# Patient Record
Sex: Female | Born: 1950 | Race: White | Hispanic: No | Smoking: Current every day smoker
Health system: Southern US, Community
[De-identification: ages and names within clinical notes are randomized; demographics above are authoritative.]

## PROBLEM LIST (undated history)

## (undated) DIAGNOSIS — M810 Age-related osteoporosis without current pathological fracture: Secondary | ICD-10-CM

## (undated) DIAGNOSIS — M797 Fibromyalgia: Secondary | ICD-10-CM

## (undated) DIAGNOSIS — J449 Chronic obstructive pulmonary disease, unspecified: Secondary | ICD-10-CM

## (undated) DIAGNOSIS — G8929 Other chronic pain: Secondary | ICD-10-CM

## (undated) DIAGNOSIS — D649 Anemia, unspecified: Secondary | ICD-10-CM

## (undated) HISTORY — PX: BREAST BIOPSY: SHX20

## (undated) HISTORY — PX: KYPHOPLASTY: SHX5884

## (undated) HISTORY — PX: CHOLECYSTECTOMY: SHX55

## (undated) HISTORY — DX: Chronic obstructive pulmonary disease, unspecified: J44.9

## (undated) HISTORY — PX: WRIST SURGERY: SHX841

## (undated) HISTORY — DX: Fibromyalgia: M79.7

## (undated) HISTORY — PX: CATARACT EXTRACTION: SUR2

## (undated) HISTORY — PX: REPLACEMENT TOTAL KNEE: SUR1224

## (undated) HISTORY — DX: Anemia, unspecified: D64.9

## (undated) HISTORY — PX: OTHER SURGICAL HISTORY: SHX169

## (undated) HISTORY — PX: EYE SURGERY: SHX253

## (undated) HISTORY — PX: KNEE SURGERY: SHX244

## (undated) HISTORY — DX: Other chronic pain: G89.29

---

## 2003-06-12 ENCOUNTER — Other Ambulatory Visit: Payer: Self-pay

## 2003-06-16 ENCOUNTER — Other Ambulatory Visit: Payer: Self-pay

## 2003-06-18 ENCOUNTER — Other Ambulatory Visit: Payer: Self-pay

## 2003-07-06 ENCOUNTER — Other Ambulatory Visit: Payer: Self-pay

## 2004-04-09 ENCOUNTER — Ambulatory Visit: Payer: Self-pay | Admitting: Physician Assistant

## 2004-04-12 ENCOUNTER — Ambulatory Visit: Payer: Self-pay | Admitting: Pain Medicine

## 2004-05-01 ENCOUNTER — Ambulatory Visit: Payer: Self-pay | Admitting: Physician Assistant

## 2004-05-03 ENCOUNTER — Encounter: Payer: Self-pay | Admitting: Pain Medicine

## 2004-05-17 ENCOUNTER — Encounter: Payer: Self-pay | Admitting: Pain Medicine

## 2004-10-02 ENCOUNTER — Ambulatory Visit: Payer: Self-pay | Admitting: Physician Assistant

## 2004-10-09 ENCOUNTER — Ambulatory Visit: Payer: Self-pay | Admitting: Pain Medicine

## 2004-11-19 ENCOUNTER — Ambulatory Visit: Payer: Self-pay | Admitting: Physician Assistant

## 2004-12-03 ENCOUNTER — Ambulatory Visit: Payer: Self-pay | Admitting: Physician Assistant

## 2005-01-17 ENCOUNTER — Ambulatory Visit: Payer: Self-pay | Admitting: Physician Assistant

## 2005-01-29 ENCOUNTER — Encounter: Payer: Self-pay | Admitting: Pain Medicine

## 2005-01-31 ENCOUNTER — Ambulatory Visit: Payer: Self-pay | Admitting: Physician Assistant

## 2005-02-14 ENCOUNTER — Ambulatory Visit: Payer: Self-pay | Admitting: Pain Medicine

## 2005-02-15 ENCOUNTER — Encounter: Payer: Self-pay | Admitting: Pain Medicine

## 2005-03-06 ENCOUNTER — Ambulatory Visit: Payer: Self-pay | Admitting: Physician Assistant

## 2005-03-19 ENCOUNTER — Ambulatory Visit: Payer: Self-pay | Admitting: Pain Medicine

## 2005-04-23 ENCOUNTER — Ambulatory Visit: Payer: Self-pay | Admitting: Physician Assistant

## 2005-05-23 ENCOUNTER — Ambulatory Visit: Payer: Self-pay | Admitting: Physician Assistant

## 2005-05-30 ENCOUNTER — Ambulatory Visit: Payer: Self-pay | Admitting: Pain Medicine

## 2005-07-08 ENCOUNTER — Ambulatory Visit: Payer: Self-pay | Admitting: Physician Assistant

## 2005-07-16 ENCOUNTER — Ambulatory Visit: Payer: Self-pay | Admitting: Pain Medicine

## 2005-07-30 ENCOUNTER — Ambulatory Visit: Payer: Self-pay | Admitting: Physician Assistant

## 2005-08-06 ENCOUNTER — Ambulatory Visit: Payer: Self-pay | Admitting: Pain Medicine

## 2005-08-19 ENCOUNTER — Ambulatory Visit: Payer: Self-pay | Admitting: Physician Assistant

## 2005-08-26 ENCOUNTER — Ambulatory Visit: Payer: Self-pay | Admitting: Family Medicine

## 2005-10-22 ENCOUNTER — Ambulatory Visit: Payer: Self-pay | Admitting: Physician Assistant

## 2005-10-26 ENCOUNTER — Emergency Department: Payer: Self-pay | Admitting: General Practice

## 2005-11-04 ENCOUNTER — Ambulatory Visit: Payer: Self-pay | Admitting: Physician Assistant

## 2006-01-03 ENCOUNTER — Ambulatory Visit: Payer: Self-pay | Admitting: Unknown Physician Specialty

## 2006-01-14 ENCOUNTER — Ambulatory Visit: Payer: Self-pay | Admitting: Unknown Physician Specialty

## 2006-02-27 ENCOUNTER — Inpatient Hospital Stay: Payer: Self-pay | Admitting: Specialist

## 2006-02-27 ENCOUNTER — Other Ambulatory Visit: Payer: Self-pay

## 2006-04-25 ENCOUNTER — Ambulatory Visit: Payer: Self-pay | Admitting: Physician Assistant

## 2006-05-26 ENCOUNTER — Ambulatory Visit: Payer: Self-pay | Admitting: Physician Assistant

## 2006-05-29 ENCOUNTER — Ambulatory Visit: Payer: Self-pay | Admitting: Specialist

## 2006-06-03 ENCOUNTER — Ambulatory Visit: Payer: Self-pay | Admitting: Pain Medicine

## 2006-06-18 ENCOUNTER — Ambulatory Visit: Payer: Self-pay | Admitting: Pain Medicine

## 2006-06-24 ENCOUNTER — Ambulatory Visit: Payer: Self-pay | Admitting: Pain Medicine

## 2006-07-16 ENCOUNTER — Ambulatory Visit: Payer: Self-pay | Admitting: Physician Assistant

## 2006-07-28 ENCOUNTER — Ambulatory Visit: Payer: Self-pay | Admitting: Physician Assistant

## 2006-08-01 ENCOUNTER — Ambulatory Visit: Payer: Self-pay | Admitting: Ophthalmology

## 2006-08-11 ENCOUNTER — Ambulatory Visit: Payer: Self-pay | Admitting: Ophthalmology

## 2006-08-28 ENCOUNTER — Ambulatory Visit: Payer: Self-pay | Admitting: Physician Assistant

## 2006-09-30 ENCOUNTER — Ambulatory Visit: Payer: Self-pay | Admitting: Ophthalmology

## 2006-09-30 ENCOUNTER — Ambulatory Visit: Payer: Self-pay | Admitting: Anesthesiology

## 2006-10-06 ENCOUNTER — Ambulatory Visit: Payer: Self-pay | Admitting: Ophthalmology

## 2006-10-21 ENCOUNTER — Ambulatory Visit: Payer: Self-pay | Admitting: Physician Assistant

## 2006-11-11 ENCOUNTER — Ambulatory Visit: Payer: Self-pay | Admitting: Physician Assistant

## 2006-11-11 ENCOUNTER — Ambulatory Visit: Payer: Self-pay | Admitting: Pain Medicine

## 2006-11-27 ENCOUNTER — Ambulatory Visit: Payer: Self-pay | Admitting: Physician Assistant

## 2006-12-16 ENCOUNTER — Ambulatory Visit: Payer: Self-pay | Admitting: Pain Medicine

## 2006-12-31 ENCOUNTER — Ambulatory Visit: Payer: Self-pay | Admitting: Physician Assistant

## 2007-01-08 ENCOUNTER — Ambulatory Visit: Payer: Self-pay | Admitting: Pain Medicine

## 2007-01-22 ENCOUNTER — Ambulatory Visit: Payer: Self-pay | Admitting: Physician Assistant

## 2007-02-04 ENCOUNTER — Ambulatory Visit: Payer: Self-pay | Admitting: Pain Medicine

## 2007-02-18 ENCOUNTER — Ambulatory Visit: Payer: Self-pay | Admitting: Pain Medicine

## 2007-03-04 ENCOUNTER — Ambulatory Visit: Payer: Self-pay | Admitting: Rheumatology

## 2007-03-23 ENCOUNTER — Ambulatory Visit: Payer: Self-pay | Admitting: Pain Medicine

## 2007-03-26 ENCOUNTER — Ambulatory Visit: Payer: Self-pay | Admitting: Pain Medicine

## 2007-06-08 ENCOUNTER — Ambulatory Visit: Payer: Self-pay | Admitting: Rheumatology

## 2007-06-09 ENCOUNTER — Emergency Department: Payer: Self-pay | Admitting: Emergency Medicine

## 2007-06-17 ENCOUNTER — Ambulatory Visit: Payer: Self-pay | Admitting: Rheumatology

## 2007-09-30 ENCOUNTER — Ambulatory Visit: Payer: Self-pay | Admitting: Family Medicine

## 2007-10-16 ENCOUNTER — Emergency Department: Payer: Self-pay | Admitting: Emergency Medicine

## 2007-12-22 ENCOUNTER — Other Ambulatory Visit: Payer: Self-pay

## 2007-12-22 ENCOUNTER — Emergency Department: Payer: Self-pay | Admitting: Emergency Medicine

## 2008-01-14 ENCOUNTER — Other Ambulatory Visit: Payer: Self-pay

## 2008-01-14 ENCOUNTER — Inpatient Hospital Stay: Payer: Self-pay | Admitting: Internal Medicine

## 2008-01-18 ENCOUNTER — Ambulatory Visit: Payer: Self-pay | Admitting: Unknown Physician Specialty

## 2008-01-28 ENCOUNTER — Ambulatory Visit: Payer: Self-pay | Admitting: Unknown Physician Specialty

## 2008-02-02 ENCOUNTER — Other Ambulatory Visit: Payer: Self-pay

## 2008-02-02 ENCOUNTER — Inpatient Hospital Stay: Payer: Self-pay | Admitting: Internal Medicine

## 2008-07-19 ENCOUNTER — Emergency Department: Payer: Self-pay | Admitting: Emergency Medicine

## 2008-08-05 ENCOUNTER — Ambulatory Visit: Payer: Self-pay | Admitting: Orthopedic Surgery

## 2008-08-05 ENCOUNTER — Ambulatory Visit: Payer: Self-pay | Admitting: Cardiology

## 2008-08-09 ENCOUNTER — Ambulatory Visit: Payer: Self-pay | Admitting: Orthopedic Surgery

## 2008-11-16 ENCOUNTER — Ambulatory Visit: Payer: Self-pay | Admitting: Orthopedic Surgery

## 2008-11-23 ENCOUNTER — Ambulatory Visit: Payer: Self-pay | Admitting: Orthopedic Surgery

## 2008-12-23 ENCOUNTER — Ambulatory Visit: Payer: Self-pay | Admitting: Unknown Physician Specialty

## 2009-01-16 ENCOUNTER — Ambulatory Visit: Payer: Self-pay | Admitting: Unknown Physician Specialty

## 2009-05-09 ENCOUNTER — Ambulatory Visit: Payer: Self-pay | Admitting: Anesthesiology

## 2009-11-03 ENCOUNTER — Ambulatory Visit: Payer: Self-pay | Admitting: Unknown Physician Specialty

## 2009-11-10 ENCOUNTER — Ambulatory Visit: Payer: Self-pay | Admitting: Unknown Physician Specialty

## 2010-06-05 ENCOUNTER — Ambulatory Visit: Payer: Self-pay | Admitting: Anesthesiology

## 2011-01-03 ENCOUNTER — Ambulatory Visit: Payer: Self-pay

## 2011-02-11 ENCOUNTER — Ambulatory Visit: Payer: Self-pay | Admitting: Unknown Physician Specialty

## 2011-02-21 ENCOUNTER — Inpatient Hospital Stay: Payer: Self-pay | Admitting: Unknown Physician Specialty

## 2011-02-28 ENCOUNTER — Inpatient Hospital Stay: Payer: Self-pay | Admitting: Internal Medicine

## 2011-02-28 DIAGNOSIS — R9431 Abnormal electrocardiogram [ECG] [EKG]: Secondary | ICD-10-CM

## 2011-08-21 ENCOUNTER — Ambulatory Visit: Payer: Self-pay | Admitting: Family Medicine

## 2011-10-07 ENCOUNTER — Emergency Department: Payer: Self-pay | Admitting: Emergency Medicine

## 2011-10-07 LAB — COMPREHENSIVE METABOLIC PANEL
Alkaline Phosphatase: 134 U/L (ref 50–136)
Anion Gap: 7 (ref 7–16)
BUN: 4 mg/dL — ABNORMAL LOW (ref 7–18)
Bilirubin,Total: 0.3 mg/dL (ref 0.2–1.0)
Calcium, Total: 8.9 mg/dL (ref 8.5–10.1)
Chloride: 105 mmol/L (ref 98–107)
Co2: 27 mmol/L (ref 21–32)
EGFR (African American): >60
EGFR (Non-African Amer.): >60
Glucose: 77 mg/dL (ref 65–99)
Potassium: 4.2 mmol/L (ref 3.5–5.1)
SGPT (ALT): 15 U/L
Sodium: 139 mmol/L (ref 136–145)
Total Protein: 7.8 g/dL (ref 6.4–8.2)

## 2011-10-07 LAB — URINALYSIS, COMPLETE
Bilirubin,UR: NEGATIVE
Blood: NEGATIVE
Glucose,UR: NEGATIVE mg/dL (ref 0–75)
Ketone: NEGATIVE
Leukocyte Esterase: NEGATIVE
Nitrite: NEGATIVE
Ph: 7 (ref 4.5–8.0)
RBC,UR: <1 /HPF (ref 0–5)
Specific Gravity: 1.003 (ref 1.003–1.030)

## 2011-10-07 LAB — ETHANOL
Ethanol %: <0.003 % (ref 0.000–0.080)
Ethanol: <3 mg/dL

## 2011-10-07 LAB — DRUG SCREEN, URINE
Barbiturates, Ur Screen: NEGATIVE (ref ?–200)
Benzodiazepine, Ur Scrn: POSITIVE (ref ?–200)
Cannabinoid 50 Ng, Ur ~~LOC~~: NEGATIVE (ref ?–50)
MDMA (Ecstasy)Ur Screen: NEGATIVE (ref ?–500)
Methadone, Ur Screen: NEGATIVE (ref ?–300)
Opiate, Ur Screen: NEGATIVE (ref ?–300)

## 2011-10-07 LAB — CBC
HCT: 37.7 % (ref 35.0–47.0)
MCH: 33.3 pg (ref 26.0–34.0)
MCHC: 33.7 g/dL (ref 32.0–36.0)
MCV: 99 fL (ref 80–100)
RBC: 3.82 10*6/uL (ref 3.80–5.20)

## 2011-10-07 LAB — SALICYLATE LEVEL: Salicylates, Serum: 3.8 mg/dL — ABNORMAL HIGH

## 2011-10-07 LAB — ACETAMINOPHEN LEVEL: Acetaminophen: <2 ug/mL

## 2011-11-15 ENCOUNTER — Other Ambulatory Visit: Payer: Self-pay | Admitting: Unknown Physician Specialty

## 2011-11-15 LAB — CLOSTRIDIUM DIFFICILE BY PCR

## 2011-11-17 LAB — STOOL CULTURE

## 2011-11-25 ENCOUNTER — Ambulatory Visit: Payer: Self-pay | Admitting: Unknown Physician Specialty

## 2011-11-27 LAB — PATHOLOGY REPORT

## 2012-04-01 ENCOUNTER — Ambulatory Visit: Payer: Self-pay | Admitting: Neurology

## 2012-04-02 ENCOUNTER — Ambulatory Visit: Payer: Self-pay | Admitting: Neurology

## 2012-08-24 ENCOUNTER — Ambulatory Visit: Payer: Self-pay | Admitting: Internal Medicine

## 2012-12-08 ENCOUNTER — Ambulatory Visit: Payer: Self-pay | Admitting: Unknown Physician Specialty

## 2013-02-17 ENCOUNTER — Ambulatory Visit: Payer: Self-pay | Admitting: Pain Medicine

## 2013-02-17 LAB — BASIC METABOLIC PANEL
Anion Gap: 7 (ref 7–16)
Calcium, Total: 9.5 mg/dL (ref 8.5–10.1)
Chloride: 106 mmol/L (ref 98–107)
Creatinine: 0.5 mg/dL — ABNORMAL LOW (ref 0.60–1.30)
EGFR (African American): >60
EGFR (Non-African Amer.): >60
Osmolality: 272 (ref 275–301)
Sodium: 137 mmol/L (ref 136–145)

## 2013-02-17 LAB — MAGNESIUM: Magnesium: 1.9 mg/dL

## 2013-02-17 LAB — SEDIMENTATION RATE: Erythrocyte Sed Rate: 33 mm/hr — ABNORMAL HIGH (ref 0–30)

## 2013-02-26 ENCOUNTER — Ambulatory Visit: Payer: Self-pay | Admitting: Unknown Physician Specialty

## 2013-03-09 ENCOUNTER — Ambulatory Visit: Payer: Self-pay | Admitting: Pain Medicine

## 2013-03-11 ENCOUNTER — Ambulatory Visit: Payer: Self-pay | Admitting: Pain Medicine

## 2013-03-31 ENCOUNTER — Ambulatory Visit: Payer: Self-pay | Admitting: Pain Medicine

## 2013-04-21 IMAGING — CR DG KNEE 1-2V*L*
1 series · 2 of 2 positions shown · non-contrast
Comparison: none

REASON FOR EXAM: postop
COMMENTS:   Bedside (portable):Y

PROCEDURE:     DXR - DXR KNEE LEFT AP AND LATERAL  - February 21, 2011  [DATE]
RESULT:

[Series 1: view not recorded · 0.17mm/px · 2 of 2 slices shown]
[im 1/2]
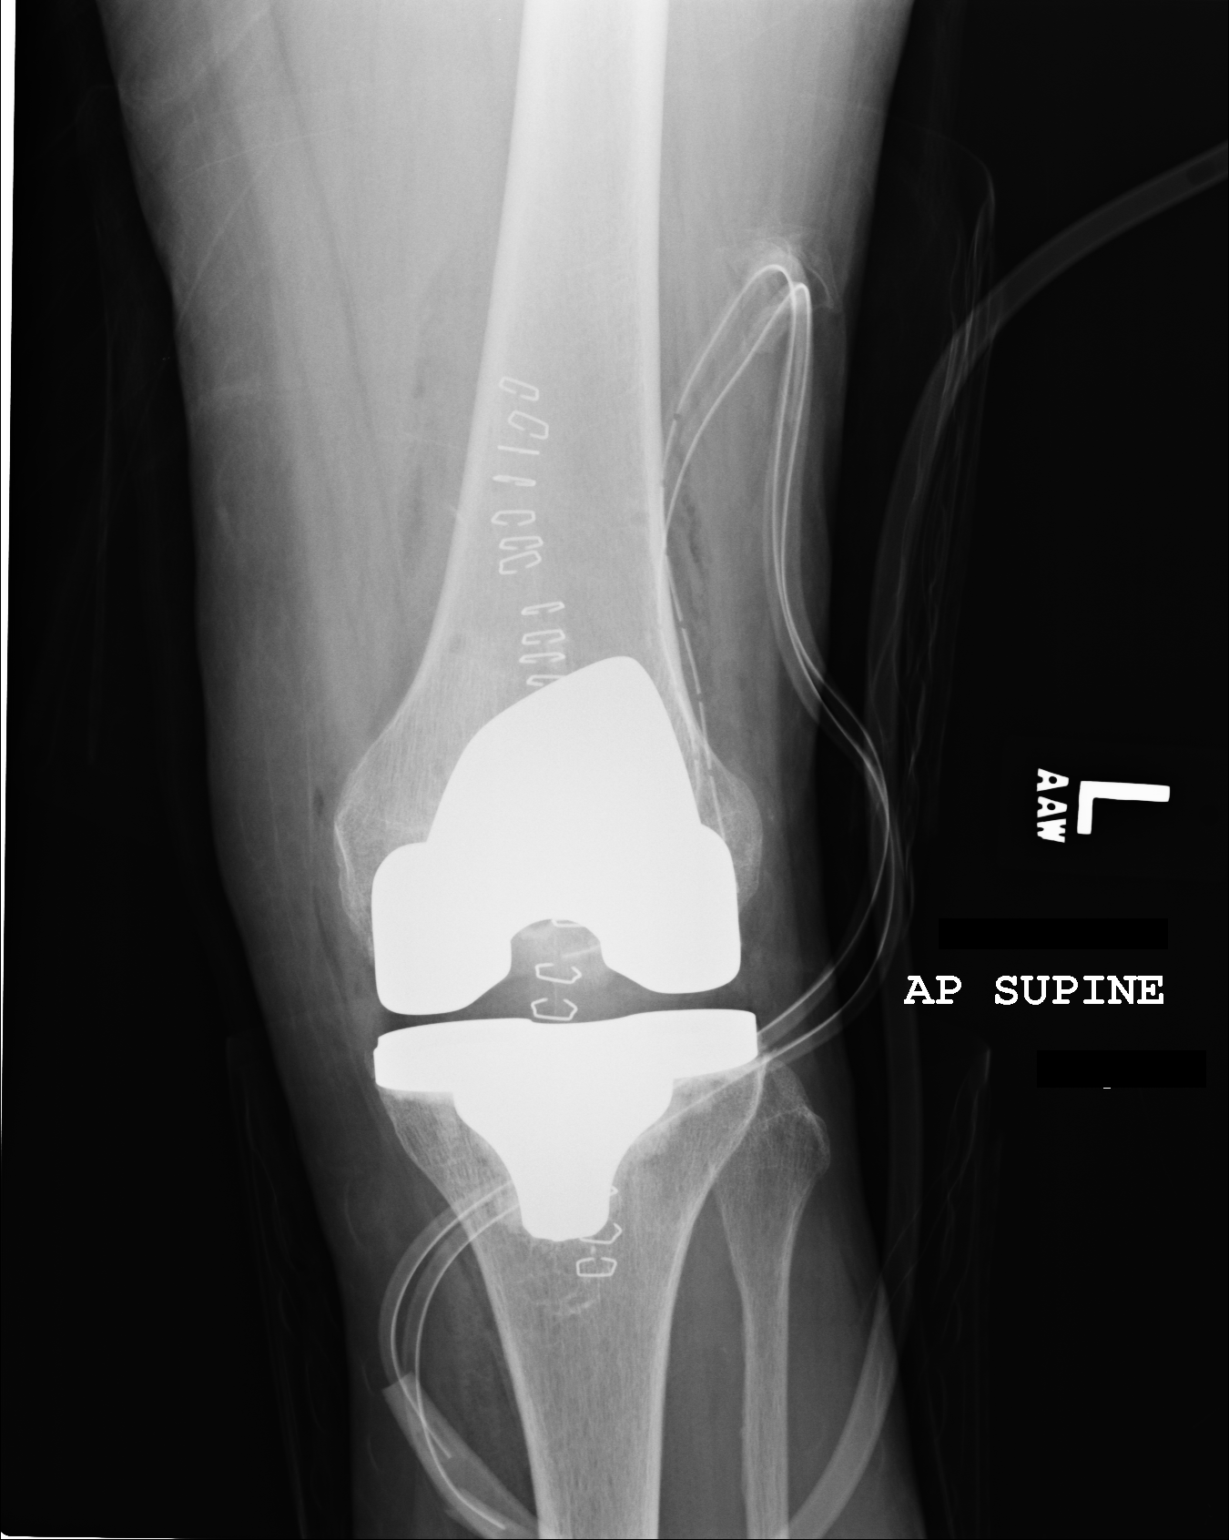
[im 2/2]
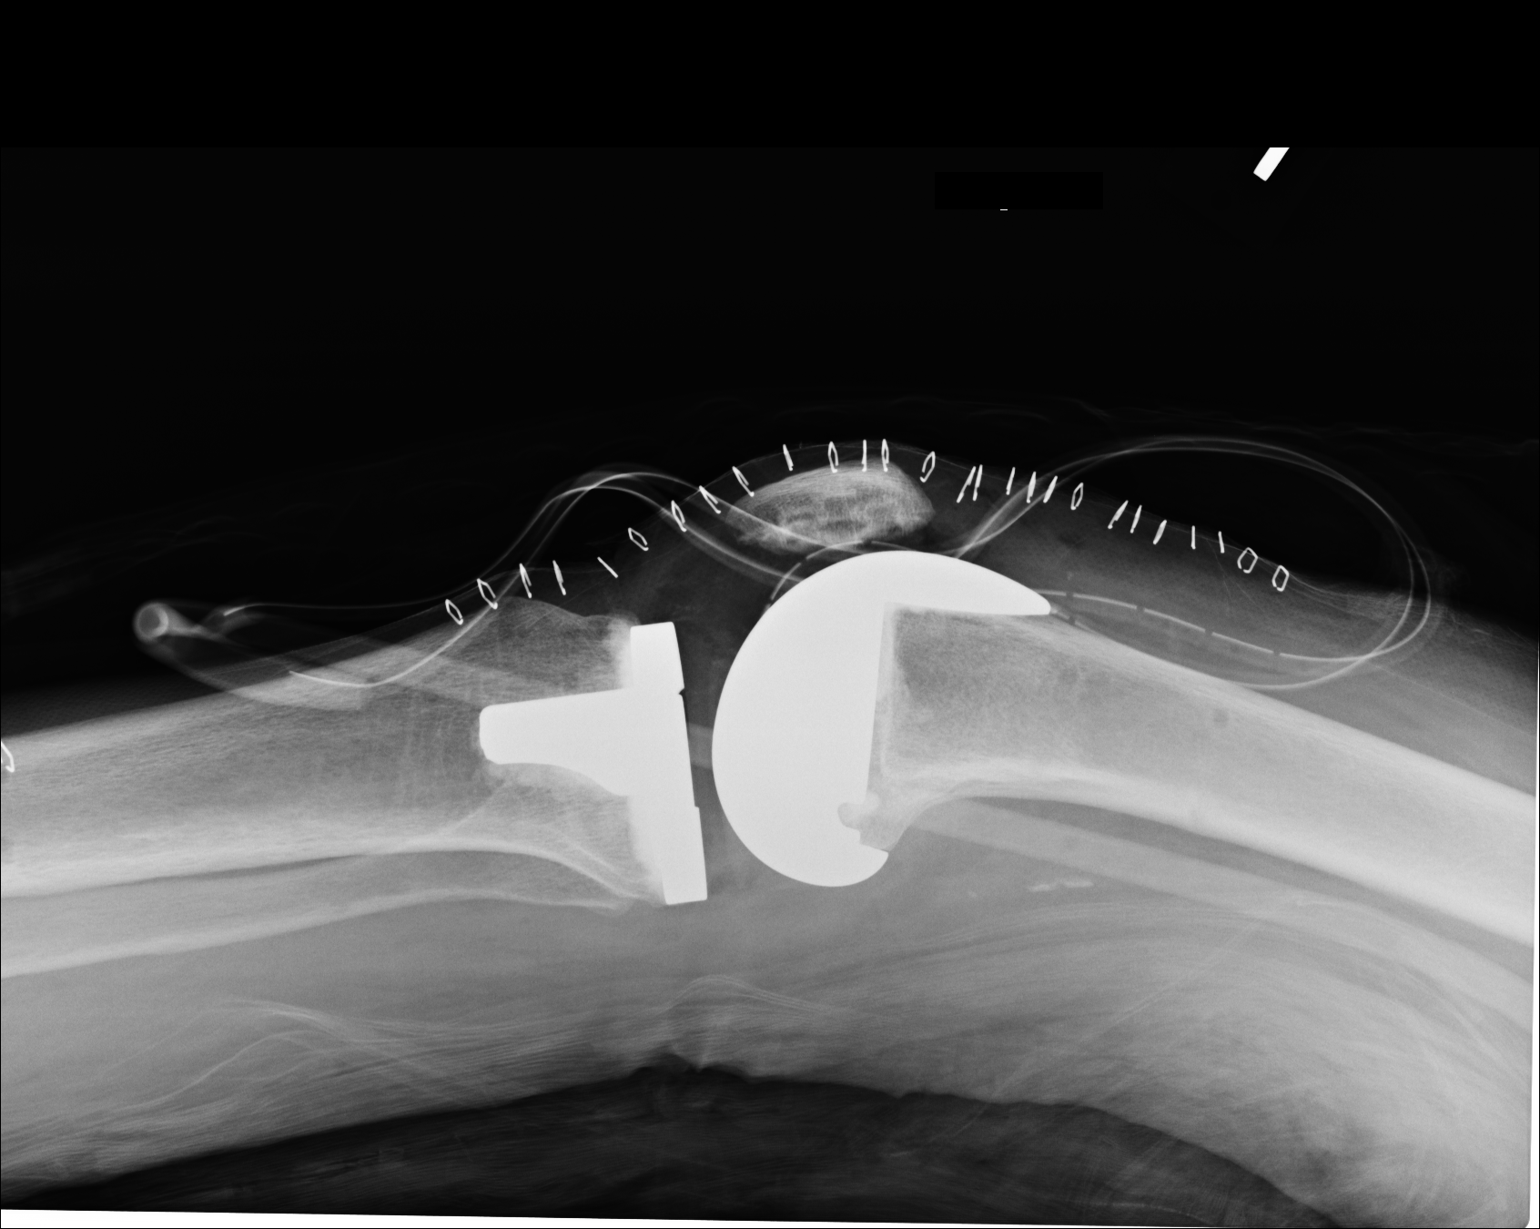

[2 of 2 positions shown; findings below may reference images not displayed]

FINDINGS: The patient is status post total left knee replacement. Hardware
appears intact without evidence of loosening or failure. Surgical drains and
skin staples are identified about the left knee. Native osseous structures
appear to be intact.
IMPRESSION: Postoperative left knee replacement. The remainder of the
interpretation will be left to the performing physician.

## 2013-04-27 IMAGING — CT CT ABD-PELV W/O CM
1 of 2 series · 15 of 32 positions shown, 19 images · non-contrast
Comparison: none

REASON FOR EXAM: (1) LLQ  pain  AMS; (2) same  AMS
COMMENTS:

[Series 2: stone · axial · 0.56mm/px · z∈[-384,+22]mm · 15 of 152 slices shown, 19 images]
[im 11/152  soft-tissue]
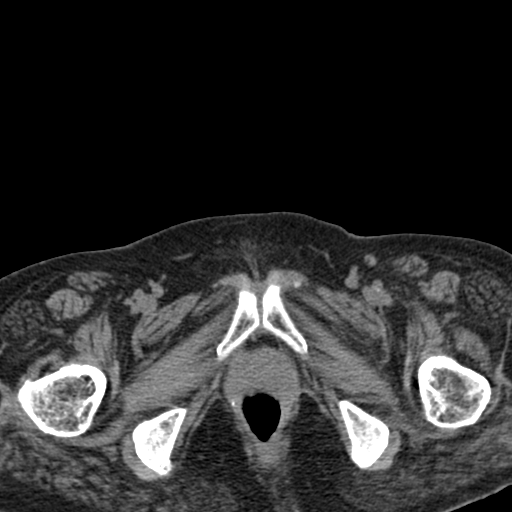
[im 11/152  bone]
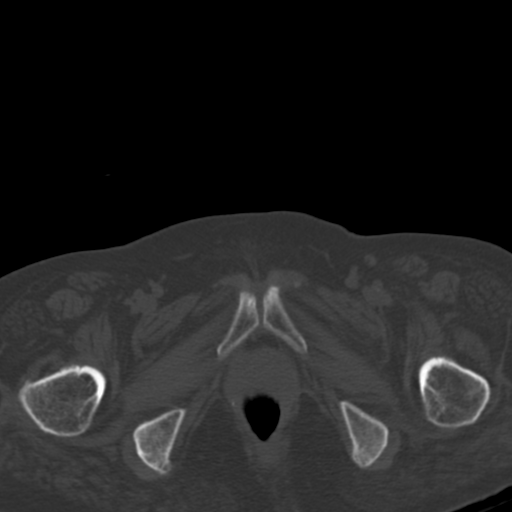
[im 22/152  soft-tissue]
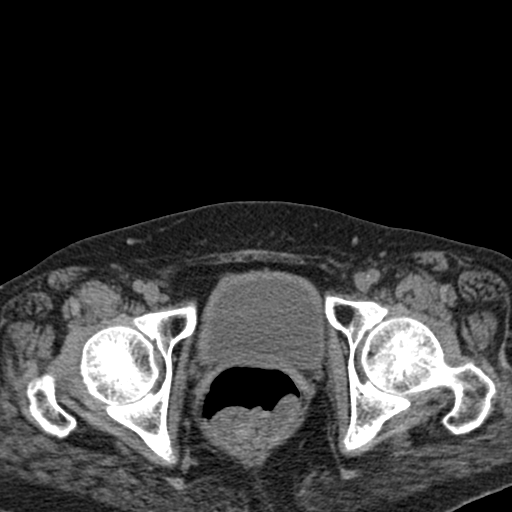
[im 33/152  soft-tissue]
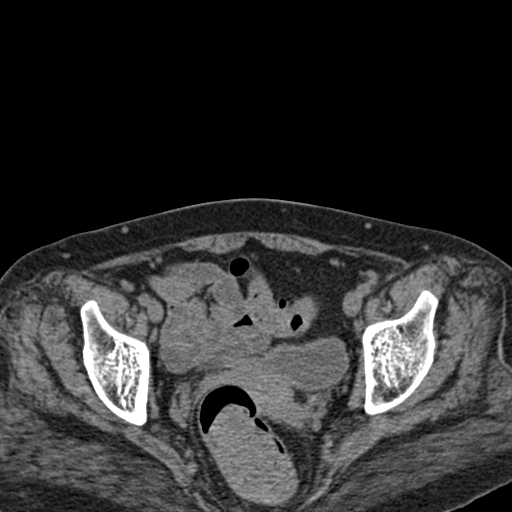
[im 44/152  soft-tissue]
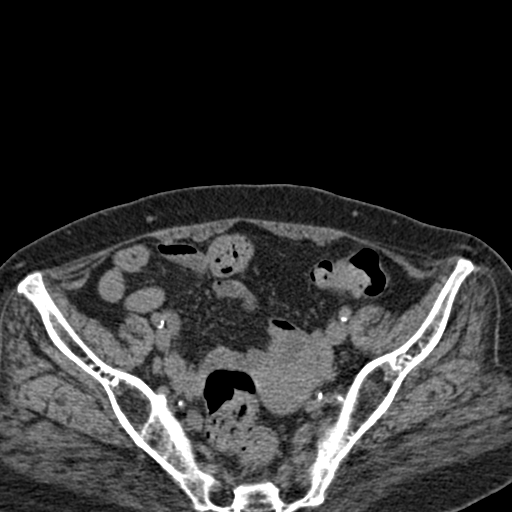
[im 54/152  soft-tissue]
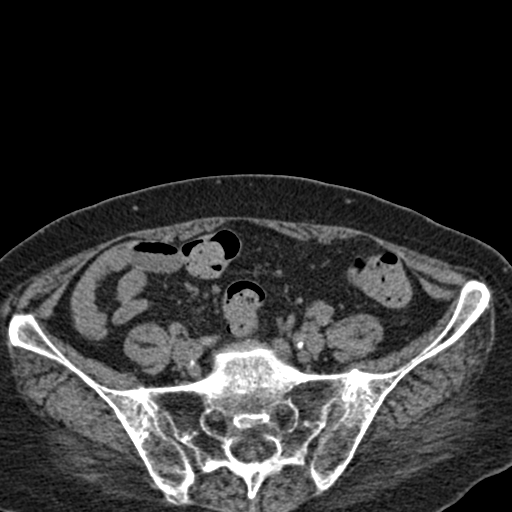
[im 65/152  soft-tissue]
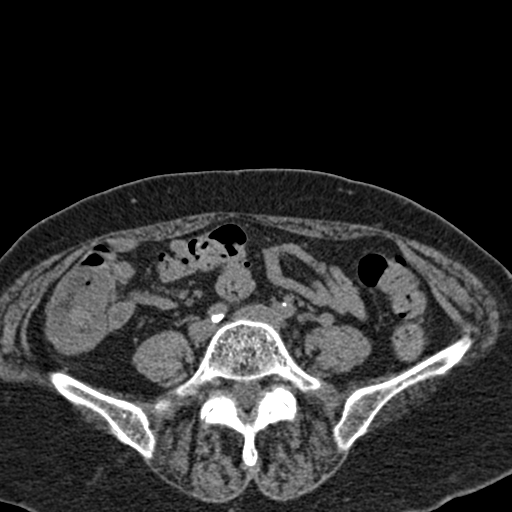
[im 76/152  soft-tissue]
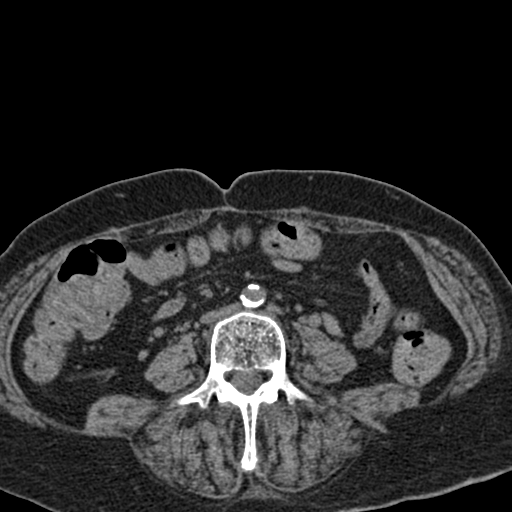
[im 87/152  soft-tissue]
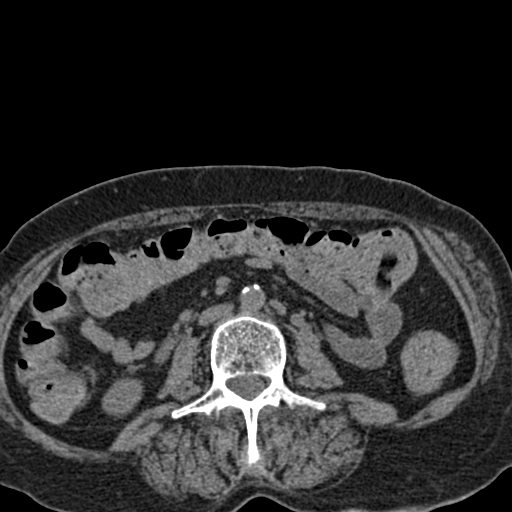
[im 98/152  soft-tissue]
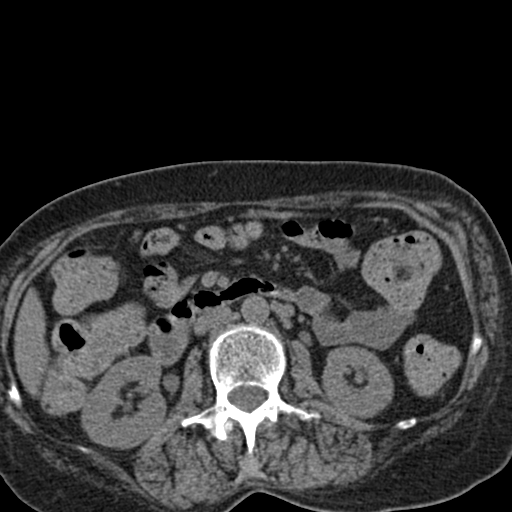
[im 98/152  bone]
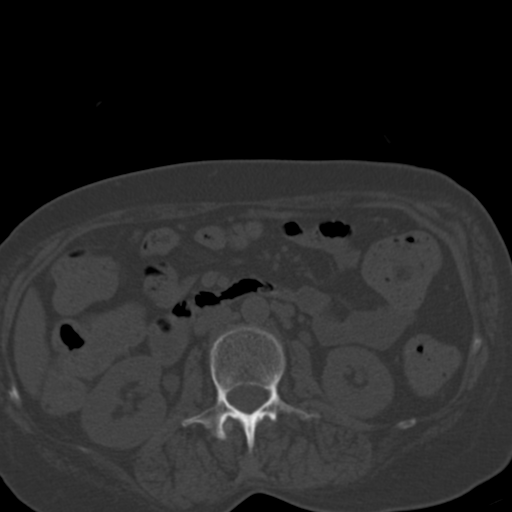
[im 108/152  soft-tissue]
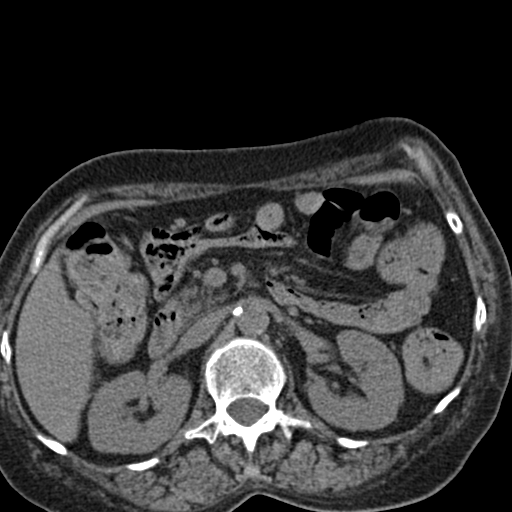
[im 119/152  soft-tissue]
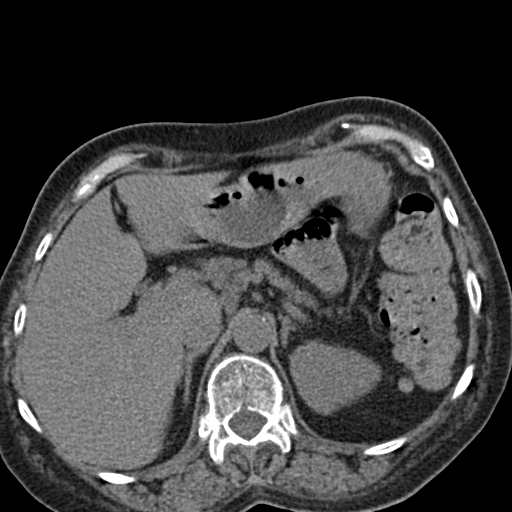
[im 130/152  soft-tissue]
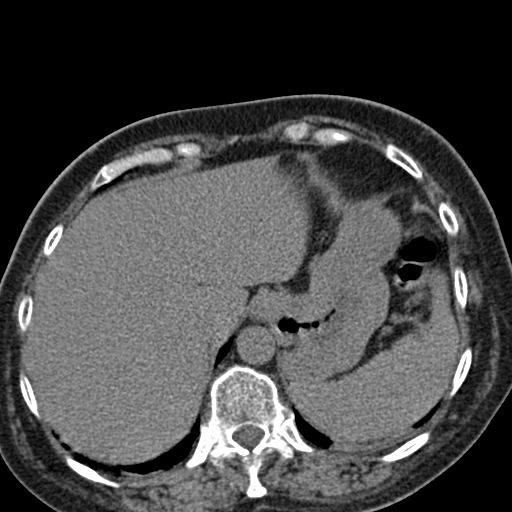
[im 130/152  lung]
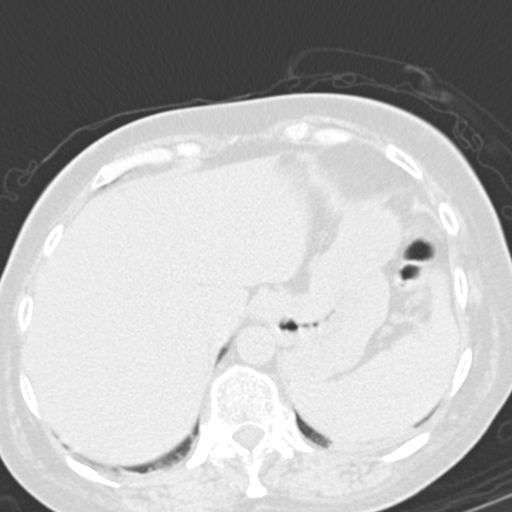
[im 135/152  lung]
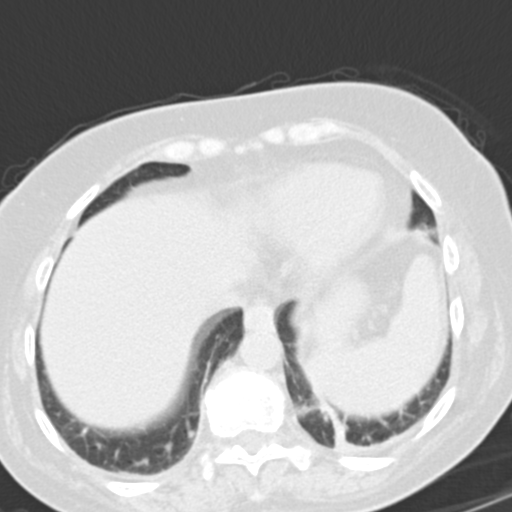
[im 141/152  soft-tissue]
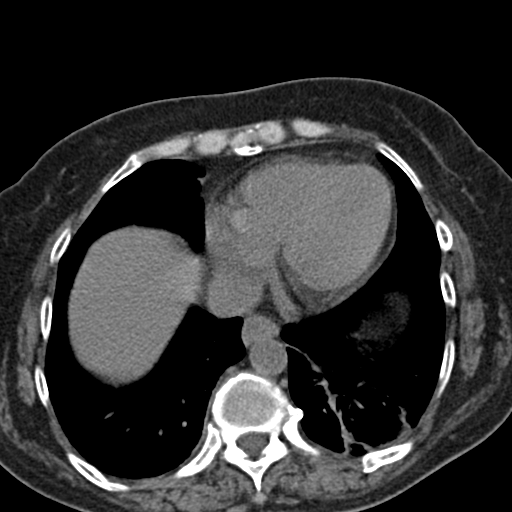
[im 141/152  lung]
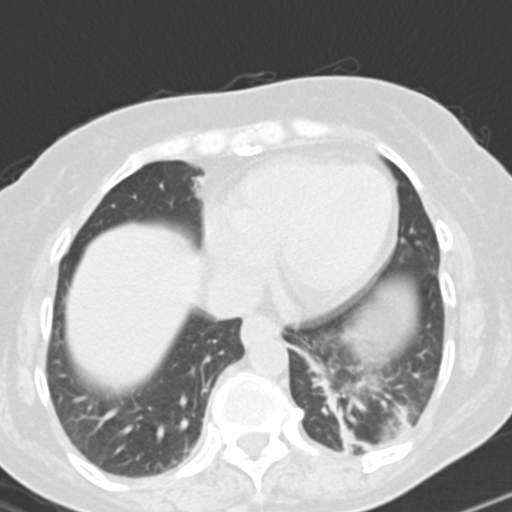
[im 146/152  lung]
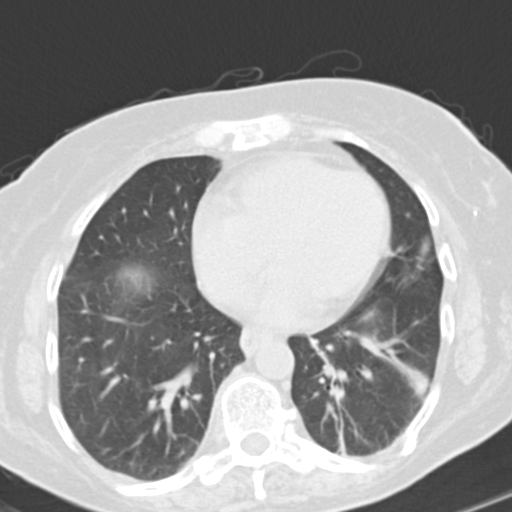

[15 of 32 positions shown; findings below may reference images not displayed]

PROCEDURE:     CT  - CT ABDOMEN AND PELVIS W[DATE]  [DATE]

RESULT:     Axial noncontrast CT scanning was performed through the abdomen
and pelvis at 3 mm intervals and slice thicknesses. Review of multiplanar
reconstructed images was performed separately on the VIA monitor.

There is considerable stool throughout the colon though the pattern does not
appear obstructive. I do not see evidence of an acute colitis nor acute
diverticulitis. The urinary bladder contains air possibly from recent
catheterization or from gas forming organisms. I do not see a inflamed
appearing loop of bowel adjacent to the urinary bladder. The uterus and
adnexal structures exhibit no acute abnormality. The small bowel exhibits no
evidence of ileus or obstruction.

The liver, spleen, partially distended stomach, pancreas, adrenal glands,
and kidneys exhibit no acute abnormality. The caliber of the abdominal aorta
is normal. I see no periaortic nor pericaval lymphadenopathy. There is no
evidence of ascites. The gallbladder is apparently surgically absent. The
lumbar vertebral bodies are preserved in height. At the left lung base there
are atelectatic versus fibrotic changes.
IMPRESSION: 1. The colonic stool and gas pattern suggests constipation. There is no
evidence of ileus or obstruction of the small or large bowel.
2. The urinary bladder is partially distended and contains gas. This may be
from recent catheterization, but the possibility of the presence of gas
forming organisms is raised. The kidneys and ureters are normal where
visualized.
3. I see no acute hepatobiliary abnormality. The gallbladder is surgically
absent.
4. There is atelectasis versus scarring at the left lung base.

## 2013-04-27 IMAGING — CR DG CHEST 1V PORT
1 series · 1 of 1 positions shown · non-contrast
Comparison: none

REASON FOR EXAM: AMS
COMMENTS:

[view not recorded]
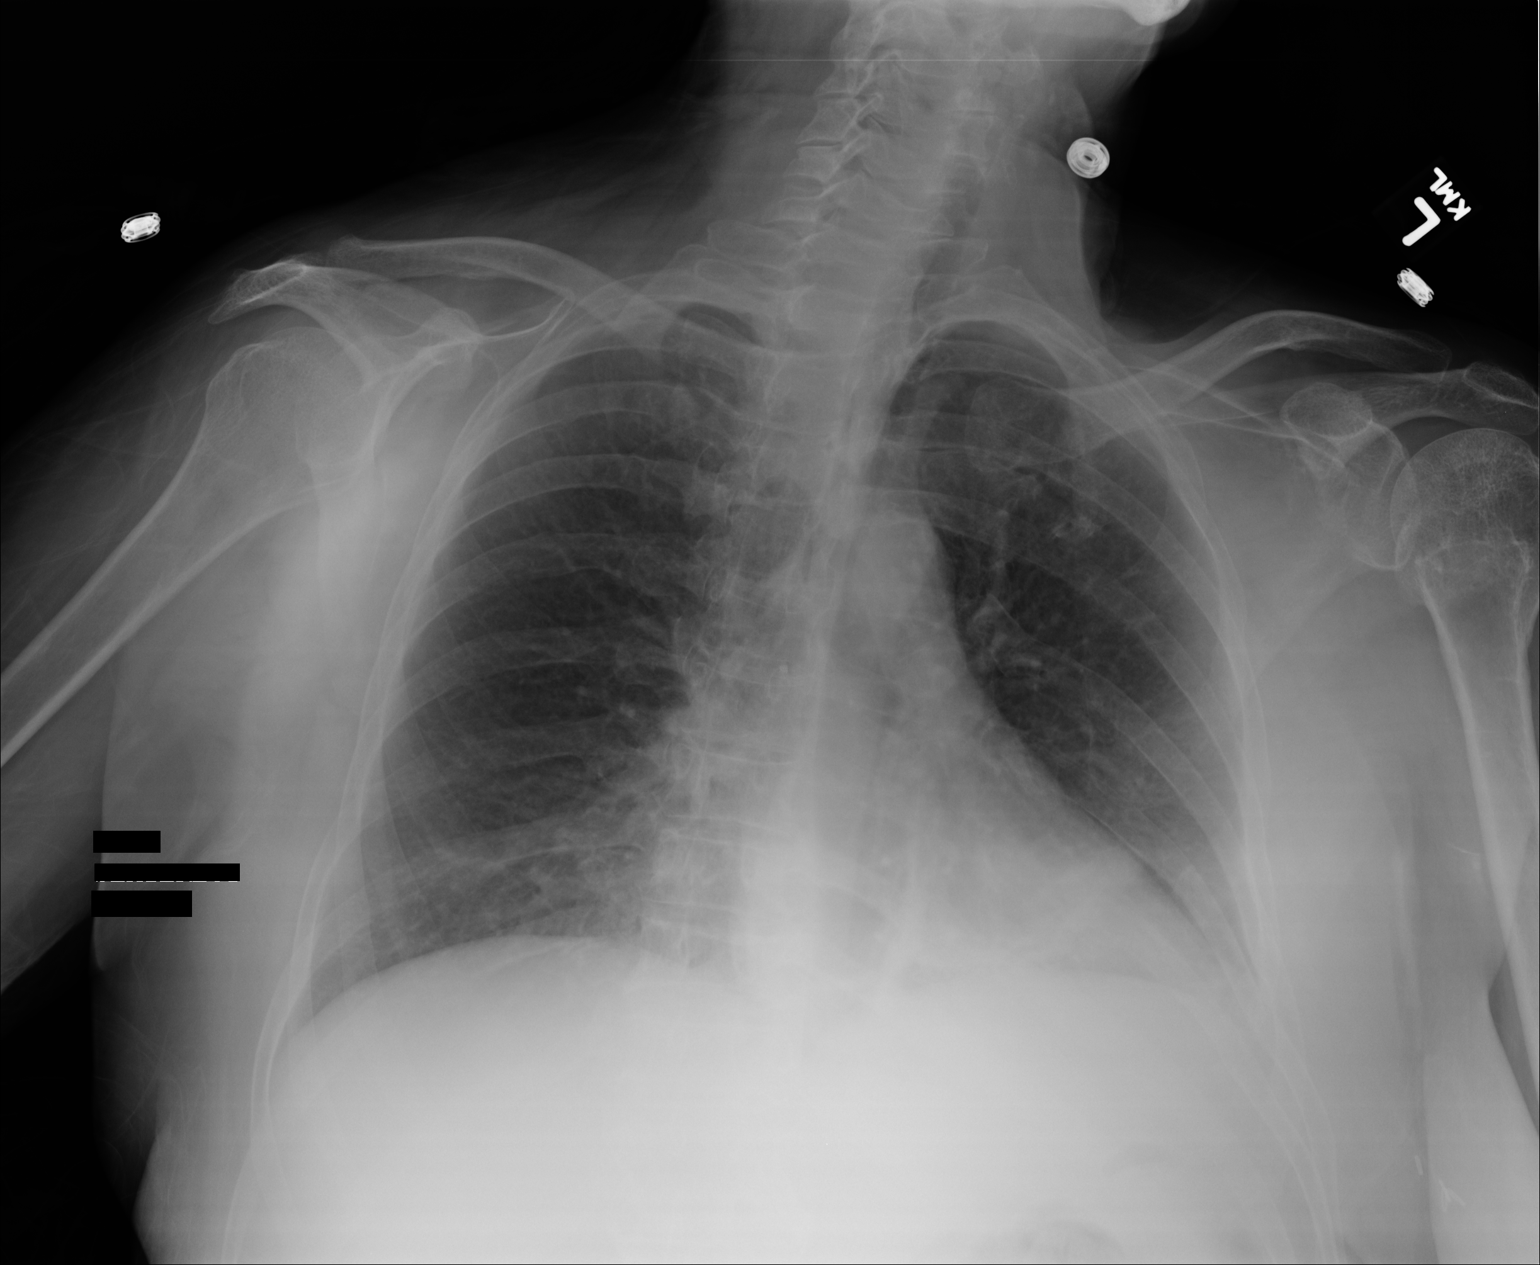

[1 of 1 positions shown; findings below may reference images not displayed]

PROCEDURE:     DXR - DXR PORTABLE CHEST SINGLE VIEW  - February 27, 2011 [DATE]

RESULT:     Comparison is made to the prior exam of 02/11/2011. There is
increased density at the left base compatible with pneumonia or atelectasis.
No acute changes of the heart or pulmonary vasculature are identified. There
is deformity of the proximal left humerus compatible with an old left
humerus fracture.
IMPRESSION: 1. There is increased density at the left base compatible with pneumonia or
atelectasis.
2. There is an old fracture of the left humerus.
3. Heart size is normal.
4. No pulmonary edema is seen.

## 2013-05-04 ENCOUNTER — Ambulatory Visit: Payer: Self-pay | Admitting: Pain Medicine

## 2013-05-24 ENCOUNTER — Ambulatory Visit: Payer: Self-pay | Admitting: Pain Medicine

## 2013-06-24 ENCOUNTER — Ambulatory Visit: Payer: Self-pay | Admitting: Pain Medicine

## 2013-07-21 ENCOUNTER — Ambulatory Visit: Payer: Self-pay | Admitting: Pain Medicine

## 2013-09-13 ENCOUNTER — Ambulatory Visit: Payer: Self-pay | Admitting: Internal Medicine

## 2013-09-25 ENCOUNTER — Emergency Department: Payer: Self-pay | Admitting: Emergency Medicine

## 2013-09-29 ENCOUNTER — Ambulatory Visit: Payer: Self-pay | Admitting: Pain Medicine

## 2013-10-03 ENCOUNTER — Emergency Department: Payer: Self-pay | Admitting: Emergency Medicine

## 2013-10-05 ENCOUNTER — Ambulatory Visit: Payer: Self-pay | Admitting: Pain Medicine

## 2013-10-18 ENCOUNTER — Ambulatory Visit: Payer: Self-pay | Admitting: Pain Medicine

## 2013-10-21 ENCOUNTER — Ambulatory Visit: Payer: Self-pay | Admitting: Pain Medicine

## 2013-10-22 LAB — PATHOLOGY REPORT

## 2013-11-01 ENCOUNTER — Ambulatory Visit: Payer: Self-pay | Admitting: Pain Medicine

## 2013-11-02 ENCOUNTER — Other Ambulatory Visit: Payer: Self-pay | Admitting: Pain Medicine

## 2013-12-07 ENCOUNTER — Ambulatory Visit: Payer: Self-pay | Admitting: Pain Medicine

## 2014-09-29 ENCOUNTER — Ambulatory Visit
Admit: 2014-09-29 | Disposition: A | Discharge status: Home / Self Care | Payer: Self-pay | Attending: Internal Medicine | Admitting: Internal Medicine

## 2014-10-03 DIAGNOSIS — M5481 Occipital neuralgia: Secondary | ICD-10-CM | POA: Insufficient documentation

## 2014-10-08 NOTE — Op Note (Signed)
PATIENT NAME:  Cheyenne Grimes, Cheyenne Grimes MR#:  [id number] DATE OF BIRTH:  1951-01-28  DATE OF PROCEDURE:  10/21/2013  REFERRING PHYSICIAN:  Dr. Barbette ReichmannVishwanath Grimes.   CONSULTING PAIN PHYSICIAN AND SURGEON:  Dr. Delano MetzFrancisco Angelino Grimes.   PREOPERATIVE DIAGNOSIS:  Osteoporosis with pathological compression fracture of L1.   POSTOPERATIVE DIAGNOSIS:  Osteoporosis with pathological compression fracture of L1.  PROCEDURE:  1.  Balloon kyphoplasty at L1 with access through both pedicles ((bilateral).  2.  Fluoroscopic guidance.  3.  Bone biopsy.   ANESTHESIA:  Heavy anesthesia sedation and a local anesthesia by the surgeon.   COMPLICATIONS:  None.   BLOOD LOSS:  10 mL.   INDICATIONS:  This is the case of a 64 year old white female patient with a long-standing history of chronic low back pain and bilateral lower extremity radiculopathy/radiculitis whom over the years has developed osteoporosis and eventually developed a pathological compression fracture of L1 with acute exacerbation of her low back pain.  Despite conservative nonoperative modalities which included bed rest, analgesics and epidural steroid injection, the patient continued with severe back pain requiring stabilization of her fracture.  PROCEDURE:  Informed consent was obtained.  The patient was then taken to or #9 where the patient was placed in the prone position.  Two fluoroscopic machines where used, one for AP and the other for lateral views.  Both machines were placed so as to have a good view of the L1 compression fracture.  As we were doing this, we also noticed what appeared to be a new fracture in the thoracic area.  We will need to look at this during her postop period.  Once we had identified the L1 compression fracture, a skin wheal using lidocaine and ropivacaine in a 50-50 combination was used to infiltrate the skin, deeper tissues, and muscles, all the way to the superior and lateral portion of the radicular shadow of the L1 vertebral  body, bilaterally.  A stab wound using an 11 scalpel was placed over the two previously infiltrated areas.  The trocar was then advanced to the superior and lateral portion of the pedicular shadow, bilaterally.  The percussion instrument was then used to engage the bone with a trocar, bilaterally.  Both trocars were advanced slowly with AP and lateral views after each three percussions.  Once the point of the trocar had past the level of the posterior vertebral wall the stylet was removed and a cortical bone biopsy was taken through the right trocar, of the midanterior portion of the vertebral body.  This was sent down to pathology for evaluation.  The drill bits were then introduced through the trocars and manually and slowly advanced in the AP and lateral views until they reached the anterior one-third of the vertebral body on the lateral view.  The drills were then removed and the balloons were inserted through both trocars.  The balloons were inflated one at a time in intervals of 0.5 cubic centimeters constantly monitoring the pressure to maintain it below 250 mmHg.  This was Grimes while taking AP and lateral views of the inflation processes.  Both the superior and the inferior end plate of the vertebral body were observed to be reduced, had a total of 3.0 mL of contrast through each balloon.  At this point the right side balloon was deflated and the process of inserting that cement was commenced.  The cement was injected after we had confirmed that it had hardened enough to decrease the chances of embolism.  The cement  was injected on the live lateral fluoroscopy in 0.5 mL intervals.  A total of 2.5 mL was injected through both the trocars observing that the cement stayed primarily in the anterior portion of the vertebral body.  At the point where we observed some of the cement begin to occupy the posterior one-third of the vertebral body, we stopped.  The injectors were removed and the trocars were slowly  moved back so as to check and make sure that no cement was tracking back through the canal.  A period of approximately 5 to 10 minutes was given to the cement to harden before completely removing the trocars.  AP and lateral views taken after having removed the trocars confirmed the cement to have stayed in the anterior two-thirds of the vertebral body with an apparent reduction, especially of the superior endplates of the vertebral body.  Pressure was placed over the wounds to control bleeding.  Once the bleeding had completely stopped, Dermabond was applied to the small incisions.  The patient was then taken to the recovery room where she was assessed and confirmed not to have any neurological changes.  The patient was provided with her medication refills as well as a prescription for oxycodone 5 mg to take 1 to 2 tablets by mouth q. eight hours as needed for severe postoperative pain.  The patient was also provided with an appointment to return to the clinic for follow-up on 10/29/2013, at 9:40 a.Grimes.  The patient tolerated the entire procedure well and was eventually discharged home under the care of an adult.    ____________________________ Cheyenne Matthis A. Laban Emperor, MD fan:ea D: 10/21/2013 15:16:04 ET T: 10/22/2013 01:45:24 ET JOB#: [id number]  cc: Cheyenne Devries A. Laban Emperor, MD, <Dictator> Cheyenne Done MD ELECTRONICALLY SIGNED 10/25/2013 16:09

## 2014-10-09 NOTE — Consult Note (Signed)
Patient has the capacity for informed consent, has the capacity for self-care/independent living, and is not meeting criteria for committment.this can easily change given her age with a combination of excess narcotic dosing. with neuro consult. note to follow.  Electronic Signatures: Keats Kingry, Adelene AmasJames S (MD)  (Signed on 23-Apr-13 13:33)  Authored  Last Updated: 23-Apr-13 13:33 by Lester CarolinaWilliford, Tearra Ouk S (MD)

## 2014-10-09 NOTE — Consult Note (Signed)
PATIENT NAME:  Cheyenne Grimes, [name] MR#:  [id number] DATE OF BIRTH:  09-29-1950  DATE OF CONSULTATION:  10/08/2011  REFERRING PHYSICIAN:   CONSULTING PHYSICIAN:  Adelene AmasJames S. Lanissa Cashen, MD  ADDENDUM:  MEDICATIONS: The following a list was brought into the Emergency Room. The active medications will be mentioned first. 1. Levaquin 500 mg daily. 2. Trazodone 50 mg daily. 3. Xanax 1 mg t.i.d.  4. Levalbuterol 1.25 mg per 3 mL inhalation solution one vial inhaled every six hours as needed for shortness of breath.  5. Tylenol 500 mg q.4 hours p.r.n. pain. 6. Methocarbamol 500 mg 1.5 tabs q.8 hours p.r.n. muscle spasm. 7. Oxycodone 5 mg one q.4 hours p.r.n. pain. 8. Milk of Magnesia 30 mL b.i.d.  9. Dulcolax suppository daily p.r.n. constipation.  10. Fluticasone 50 mcg inhalation powder two sprays each nostril once a day. 11. Vitamin B Complex with iron b.i.d.  12. Pantoprazole 40 mg b.i.d.  13. Neurontin 800 mg q.6 hours.  14. Ipratropium bromide 0.03% two sprays each nostril 3 times a day. 15. Theophylline 200 mg extended-release 1 b.i.d.  16. Senna Plus 50 mg/8.6 mg oral tablets 1 b.i.d.  17. Celecoxib 200 mg oral capsule one 2 times a day.   18. TED hose. 19. Tiotropium 18 mcg inhaled one cap via hand inhaler once a day at 9 a.m. for chronic airway obstruction.  20. There was a bottle of Rivaroxaban 10 mg one orally once a day at 5 p.m. given for 12 days. The start date was 02/26/2011, ending date 03/09/2012.   ____________________________ Adelene AmasJames S. Edelin Fryer, MD jsw:drc D: 10/08/2011 15:23:35 ET T: 10/08/2011 16:08:34 ET JOB#: [id number] Lester CarolinaJAMES S Crystalina Stodghill MD ELECTRONICALLY SIGNED 10/16/2011 1:46

## 2014-10-09 NOTE — Consult Note (Signed)
PATIENT NAME:  Cheyenne Grimes, Cheyenne Grimes MR#:  [id number] DATE OF BIRTH:  [birth year]  DATE OF CONSULTATION:  10/08/2011  REFERRING PHYSICIAN:  Daryel November, MD  CONSULTING PHYSICIAN:  Adelene Amas. Corrado Hymon, MD  REASON FOR CONSULTATION: Mental status changes, assess etiology and role of psychotropic medications as well as narcotics.   The patient's family including children have been very concerned about her potential self neglect as well as her difficulty concentrating, decreased alertness, and difficulty with memory. They are concerned that she has been taking too many medications that can undermine her faculties. They have had to take some of her medication out of the house. The patient is aware of this. The patient suspects at times that the children are taking them to abuse them.   At the time of the undersigned's exam, the patient is fully alert and provides a normal mental status examination. Please see below.   However, upon arrival to the Emergency Department she appeared disheveled with poor hygiene. She had poor eye contact and was distractible. She was having difficulty falling asleep. She was also reporting depressed mood and anhedonia. There was poor concentration, slow speech, and also she was showing memory difficulty. The patient's daughter had reported that the patient had been exhibiting auditory and visual hallucinations prior to the Emergency Room visit.   There is report of medications being in the home such as Percocet, Xanax, and morphine. The patient has been receiving narcotics for her back pain and knee pain. She states that she has been tried on transdermal neuro stimulation in the past. Her recent psychotropic medication includes Xanax 1 mg t.i.d. which controls excessive worry, feeling on edge, and muscle tension. She also is on trazodone 50 mg at bedtime.   PAST PSYCHIATRIC HISTORY: Cheyenne Grimes have a history of major depression. She has been treated by a psychiatrist in the  local area. She has no other functional psychiatric history. Of note, her husband died nine years ago of cancer.   FAMILY PSYCHIATRIC HISTORY: None known.   SOCIAL HISTORY: Cheyenne Grimes alone. She Grimes have a history of alcohol but quit several years ago. She Grimes not use illegal drugs   In September of 2012 in a previous hospitalization at [facility], she was admitted for possible pneumonia as well as altered mental status. It was noted that she had a history of having a postoperative course complicated by delirium tremens. She went on to a rehab center. She had to be taken out of the rehab center due to hallucinating and irrational behavior. It was at that point that she was readmitted to the [facility] on 02/27/2011.   Psychiatric consultation to assess if the delirium is secondary to infection.   At that time she had been treated for depression with Cymbalta 60 mg daily. She was on thiamine 150 mg daily. Dr. Maryruth Bun of Psychiatry was considering that Xanax withdrawal might have been contributing to her delirium.   No history of suicide attempts. There was a psychiatric hospitalization in 2005 for depression.   She has been treated before on Cymbalta, Celexa, Remeron, Wellbutrin, as well as Xanax.   FAMILY PSYCHIATRIC HISTORY: A sister has had difficulty with substance abuse. Father was an alcoholic. There has been depression in siblings. A brother has abused cocaine and has developed alcoholic hepatitis.    SOCIAL HISTORY: In addition to the above, the patient's extensive drinking has been as high as over 20 beers a day. There is  no known use of cocaine, however, she did show a positive urine drug screen in the past for amphetamines and marijuana.   PAST MEDICAL HISTORY: 1. Anemia. 2. History of left knee arthroplasty. 3. Peptic ulcer disease. 4. Hypertension. 5. Chronic obstructive pulmonary disease.  6. Cholecystectomy.  7. Bilateral  cataract surgery.   ALLERGIES: Penicillin, contrast dye, iodine.   LABORATORY, DIAGNOSTIC, AND RADIOLOGICAL DATA: EKG showed a QTc of 452 ms.  Head CT without contrast showed no acute intracranial process.   Urine drug screen was positive for benzodiazepines. TSH normal. Salicylates unremarkable. CBC unremarkable. Complete metabolic panel unremarkable. Tylenol negative. Urinalysis unremarkable.   REVIEW OF SYSTEMS: Constitutional, HEENT, mouth, neurologic, psychiatric, cardiovascular, respiratory, gastrointestinal, genitourinary, skin, musculoskeletal, hematologic, lymphatic, endocrine, metabolic all unremarkable.   ADDITIONAL PSYCHIATRIC: The patient describes a normal mood as well as normal interests and normal hope. She describes her interests in country music including older artists such as [employer] and [employer]. She smiles when discussing her interest in country music. She has no thoughts of harming herself or others. She is having no hallucinations. Her memory and orientation function are normal during the undersigned's interview. Please see below.   PHYSICAL EXAMINATION:   VITAL SIGNS: Temperature 97.8, pulse 68, respiratory rate 20, blood pressure 137/76.   GENERAL APPEARANCE: Cheyenne Grimes is a well developed, well nourished, middle-aged female sitting up on her hospital gurney with no abnormal involuntary movements. She has no cachexia. Her grooming and hygiene are normal. Her speech involves normal rate and prosody without dysarthria. Thought process is logical, coherent, and goal directed. No looseness of associations or tangents. Thought content no thoughts of harming herself, no thoughts of harming others, no delusions, no hallucinations. Her affect is broad and appropriate. Mood is within normal limits. Insight is intact. Judgment is intact.   ASSESSMENT:  AXIS I:  1. Major depressive disorder, recurrent, in remission.  2. Delirium, not otherwise specified. This may have been  secondary to a combination of narcotics and her benzodiazepine in a 64 year old CNS. This has now cleared, however, further evaluation regarding delirium causes is warranted with her neurological consultation pending.  3. History of polysubstance dependence.   AXIS II: Deferred.   AXIS III: Hypothyroidism. Please see the past medical history.   AXIS IV: Primary support group, general medical.   AXIS V: 55.   Cheyenne Grimes Grimes present the ability to make a consistent choice to differentiate between her options and their associated risks versus benefits. She can appreciate her medical problems and the risks that they present to her personally. She can also reason well.   She Grimes have the capacity for informed consent as well as the capacity for self care and independent living.   However, this ability can change quickly and dramatically if she has excess of sedating medications that can undermine her cognition and memory.   RECOMMENDATIONS:  1. Would have her follow-up with her psychiatrist within 7 to 14 days of leaving the Emergency Room for ongoing management of depression and helping with ongoing substance abuse prevention.  2. 12-step meetings and a 12-step sponsor outpatient substance abuse prevention.  3. Would add thiamine 50 mg daily to augment her diet given her history of alcoholism.  4. Concur with the Neurology consult for further evaluation of potential delirium etiology particularly given that delirium can wax and wane.   ____________________________ Adelene Amas. Debbora Ang, MD jsw:drc D: 10/08/2011 14:56:50 ET T: 10/08/2011 15:20:33 ET JOB#: [id number]  cc: Adelene Amas. Ronnel Zuercher, MD, <  Dictator> Lester CarolinaJAMES S Quintavia Rogstad MD ELECTRONICALLY SIGNED 10/16/2011 1:46

## 2014-11-16 ENCOUNTER — Encounter: Payer: Self-pay | Admitting: Emergency Medicine

## 2014-11-16 ENCOUNTER — Emergency Department
Admission: EM | Admit: 2014-11-16 | Discharge: 2014-11-16 | Disposition: A | Discharge status: Other Acute Inpatient Hospital | Payer: Commercial Managed Care - HMO | Attending: Emergency Medicine | Admitting: Emergency Medicine

## 2014-11-16 ENCOUNTER — Emergency Department: Payer: Commercial Managed Care - HMO

## 2014-11-16 DIAGNOSIS — Y9289 Other specified places as the place of occurrence of the external cause: Secondary | ICD-10-CM | POA: Insufficient documentation

## 2014-11-16 DIAGNOSIS — W08XXXA Fall from other furniture, initial encounter: Secondary | ICD-10-CM | POA: Diagnosis not present

## 2014-11-16 DIAGNOSIS — S59902A Unspecified injury of left elbow, initial encounter: Secondary | ICD-10-CM | POA: Diagnosis present

## 2014-11-16 DIAGNOSIS — Z880 Allergy status to penicillin: Secondary | ICD-10-CM | POA: Diagnosis not present

## 2014-11-16 DIAGNOSIS — Y9389 Activity, other specified: Secondary | ICD-10-CM | POA: Diagnosis not present

## 2014-11-16 DIAGNOSIS — Z720 Tobacco use: Secondary | ICD-10-CM | POA: Insufficient documentation

## 2014-11-16 DIAGNOSIS — S42402A Unspecified fracture of lower end of left humerus, initial encounter for closed fracture: Secondary | ICD-10-CM | POA: Diagnosis not present

## 2014-11-16 DIAGNOSIS — Y998 Other external cause status: Secondary | ICD-10-CM | POA: Diagnosis not present

## 2014-11-16 HISTORY — DX: Age-related osteoporosis without current pathological fracture: M81.0

## 2014-11-16 LAB — CBC
HCT: 38.1 % (ref 35.0–47.0)
HEMOGLOBIN: 13.4 g/dL (ref 12.0–16.0)
MCH: 36.4 pg — AB (ref 26.0–34.0)
MCHC: 35.1 g/dL (ref 32.0–36.0)
MCV: 103.8 fL — ABNORMAL HIGH (ref 80.0–100.0)
PLATELETS: 217 10*3/uL (ref 150–440)
RBC: 3.67 MIL/uL — AB (ref 3.80–5.20)
RDW: 13.8 % (ref 11.5–14.5)
WBC: 15.3 10*3/uL — AB (ref 3.6–11.0)

## 2014-11-16 LAB — BASIC METABOLIC PANEL
Anion gap: 7 (ref 5–15)
BUN: 7 mg/dL (ref 6–20)
CALCIUM: 8.6 mg/dL — AB (ref 8.9–10.3)
CO2: 24 mmol/L (ref 22–32)
Chloride: 98 mmol/L — ABNORMAL LOW (ref 101–111)
Creatinine, Ser: 0.5 mg/dL (ref 0.44–1.00)
GFR calc Af Amer: >60 mL/min (ref >60)
Glucose, Bld: 108 mg/dL — ABNORMAL HIGH (ref 65–99)
POTASSIUM: 4.1 mmol/L (ref 3.5–5.1)
Sodium: 129 mmol/L — ABNORMAL LOW (ref 135–145)

## 2014-11-16 LAB — URINALYSIS COMPLETE WITH MICROSCOPIC (ARMC ONLY)
BILIRUBIN URINE: NEGATIVE
Bacteria, UA: NONE SEEN
GLUCOSE, UA: NEGATIVE mg/dL
HGB URINE DIPSTICK: NEGATIVE
KETONES UR: NEGATIVE mg/dL
LEUKOCYTES UA: NEGATIVE
NITRITE: NEGATIVE
PH: 6 (ref 5.0–8.0)
PROTEIN: NEGATIVE mg/dL
SPECIFIC GRAVITY, URINE: 1.002 — AB (ref 1.005–1.030)
Squamous Epithelial / LPF: NONE SEEN
WBC UA: NONE SEEN WBC/hpf (ref 0–5)

## 2014-11-16 MED ORDER — ONDANSETRON HCL 4 MG/2ML IJ SOLN
INTRAMUSCULAR | Status: AC
  Administered 2014-11-16: 4 mg via INTRAVENOUS
  Filled 2014-11-16: qty 2

## 2014-11-16 MED ORDER — MORPHINE SULFATE 4 MG/ML IJ SOLN
4.0000 mg | Freq: Once | INTRAMUSCULAR | Status: AC
Start: 2014-11-16 — End: 2014-11-16
  Administered 2014-11-16: 4 mg via INTRAVENOUS

## 2014-11-16 MED ORDER — MORPHINE SULFATE 4 MG/ML IJ SOLN
INTRAMUSCULAR | Status: AC
  Administered 2014-11-16: 4 mg via INTRAVENOUS
  Filled 2014-11-16: qty 1

## 2014-11-16 MED ORDER — MORPHINE SULFATE 4 MG/ML IJ SOLN
INTRAMUSCULAR | Status: AC
  Filled 2014-11-16: qty 1

## 2014-11-16 MED ORDER — ONDANSETRON HCL 4 MG/2ML IJ SOLN
4.0000 mg | Freq: Once | INTRAMUSCULAR | Status: AC
  Administered 2014-11-16: 4 mg via INTRAVENOUS

## 2014-11-16 MED ORDER — MORPHINE SULFATE 4 MG/ML IJ SOLN
4.0000 mg | Freq: Once | INTRAMUSCULAR | Status: AC
  Administered 2014-11-16: 4 mg via INTRAVENOUS

## 2014-11-16 MED ORDER — MORPHINE SULFATE 4 MG/ML IJ SOLN
4.0000 mg | INTRAMUSCULAR | Status: DC | PRN
  Administered 2014-11-16: 4 mg via INTRAVENOUS

## 2014-11-16 NOTE — ED Notes (Addendum)
Pt arrived via [facility] from home for painful left elbow for traumatic injury after falling off a 6" step-stool. Pt denies LOC, passing out or hitting her head. Pt has a notable deformity to the left elbow with no deficits to circulation or sensation. Pt is AOx4 in mild pain distress. MD present at arrival.

## 2014-11-16 NOTE — ED Notes (Signed)
ocl posterior left elbow splint applied by this writer along with arm sling. Cms intact.  Pt states she feels better with arm splinted and in sling.

## 2014-11-16 NOTE — ED Provider Notes (Signed)
[facility] Emergency Department Provider Note  ____________________________________________  Time seen: Approximately 357 AM  I have reviewed the triage vital signs and the nursing notes.   HISTORY  Chief Complaint Fall    HPI Cheyenne Grimes is a 64 y.o. female who fell off of a step stool tonight at 2115. The patient reports that she fell onto her left elbow and has been in pain since then. Per EMS the patient had some deformity but called because she was having so much pain. The patient reports that she could not get back onto the stool because of a previous knee surgery. Per EMS the patient took over an hour and a half after she fell to get back into the house. The patient reports that she was getting on the stool on the back porch and slipped and went straight down on her elbow. She reports that she has pain in her entire left arm and is a 10 out of 10 in intensity. The patient reports that she had her back against the wall but denies hitting her head or passing out.   Past Medical History  Diagnosis Date  . Osteoporosis    fibromyalgia Chronic back pain  There are no active problems to display for this patient.   Past Surgical History  Procedure Laterality Date  . Left shoulder surgery      No current outpatient prescriptions on file.  Allergies Ivp dye; Iodides; and Penicillins  History reviewed. No pertinent family history.  Social History History  Substance Use Topics  . Smoking status: Current Every Day Smoker -- 0.50 packs/day for 40 years    Types: Cigarettes  . Smokeless tobacco: Not on file  . Alcohol Use: No    Review of Systems Constitutional: No fever/chills Eyes: No visual changes. ENT: No sore throat. Cardiovascular: Denies chest pain. Respiratory: Denies shortness of breath. Gastrointestinal: No abdominal pain.  No nausea, no vomiting.   Genitourinary: Negative for dysuria. Musculoskeletal: Left arm pain, Negative  for back pain. Skin: Negative for rash. Neurological: Negative for headaches  10-point ROS otherwise negative.  ____________________________________________   PHYSICAL EXAM:  VITAL SIGNS: ED Triage Vitals  Enc Vitals Group     BP 11/16/14 0412 153/120 mmHg     Pulse Rate 11/16/14 0412 99     Resp 11/16/14 0412 20     Temp 11/16/14 0412 98.6 F (37 C)     Temp Source 11/16/14 0412 Oral     SpO2 11/16/14 0412 98 %     Weight 11/16/14 0412 174 lb (78.926 kg)     Height 11/16/14 0412  (1.676 m)     Head Cir --      Peak Flow --      Pain Score 11/16/14 0415 10     Pain Loc --      Pain Edu? --      Excl. in GC? --     Constitutional: Alert and oriented. Well appearing and in severe distress. Eyes: Conjunctivae are normal. PERRL. EOMI. Head: Atraumatic. Nose: No congestion/rhinnorhea. Mouth/Throat: Mucous membranes are moist.  Oropharynx non-erythematous. Cardiovascular: Normal rate, regular rhythm. Grossly normal heart sounds.  Good peripheral circulation. Respiratory: Normal respiratory effort.  No retractions. Lungs CTAB. Gastrointestinal: Soft and nontender. No distention. Positive bowel sounds Genitourinary: Deferred Musculoskeletal: Pain to palpation of left shoulder, pain to palpation of left elbow. Positive left radial pulses, motion distally intact. Neurologic:  Normal speech and language. No gross focal neurologic deficits are  appreciated. S Skin:  Skin is warm, dry and intact. No rash noted. Psychiatric: Mood and affect are normal.   ____________________________________________   LABS (all labs ordered are listed, but only abnormal results are displayed)  Labs Reviewed  CBC - Abnormal; Notable for the following:    WBC 15.3 (*)    RBC 3.67 (*)    MCV 103.8 (*)    MCH 36.4 (*)    All other components within normal limits  BASIC METABOLIC PANEL - Abnormal; Notable for the following:    Sodium 129 (*)    Chloride 98 (*)    Glucose, Bld 108 (*)     Calcium 8.6 (*)    All other components within normal limits  URINALYSIS COMPLETEWITH MICROSCOPIC ([facility] ONLY) - Abnormal; Notable for the following:    Color, Urine STRAW (*)    APPearance CLEAR (*)    Specific Gravity, Urine 1.002 (*)    All other components within normal limits   ____________________________________________  EKG  none ____________________________________________  RADIOLOGY  Shoulder x-ray: No acute shoulder fracture deformity or dislocation. Remote proximal humerus fracture, remote left posterior lateral rib fractures Elbow x-ray: Acute displaced impacted intra-articular distal humerus fracture without dislocation Humerus x-ray Acute displaced impacted intra-articular distal humerus fracture without dislocation ____________________________________________   PROCEDURES  Procedure(s) performed: None  Critical Care performed: No  ____________________________________________   INITIAL IMPRESSION / ASSESSMENT AND PLAN / ED COURSE  Pertinent labs & imaging results that were available during my care of the patient were reviewed by me and considered in my medical decision making (see chart for details).  This is a 64 year old female who comes in today after falling off of a stool at home with some significant pain in her left arm specifically her elbow and shoulder. The patient received 4mg  of  morphine and 4mg  of  Zofran and received x-rays.  The patient does have a distal humerus fracture. I contacted Dr.POGGI who is our orthopedic surgeon on call who felt that the fracture was too complex for his abilities. [facility] also felt that the patient needed a specialist. I contacted [facility] who initially felt that the patient may be able to be discharged home but given the amount of pain that she is in does accept [facility] evaluation of the fracture and surgical evaluation. I discussed this with Dr.  Joanette GulaGaRRIgUES who does [facility] the [facility]. ____________________________________________   FINAL CLINICAL IMPRESSION(S) / ED DIAGNOSES  Final diagnoses:  Humerus distal fracture, left, closed, initial encounter      Rebecka ApleyAllison P Hadlyn Amero, MD 11/16/14 [phone]

## 2014-11-16 NOTE — ED Notes (Signed)
Patient is resting comfortably. 

## 2014-11-16 NOTE — ED Notes (Signed)
Report called to Ronaldo MiyamotoKyle, RN. ED [facility].  Pt will transported by [facility].

## 2015-01-11 ENCOUNTER — Encounter: Payer: Self-pay | Admitting: Anesthesiology

## 2015-01-11 ENCOUNTER — Ambulatory Visit: Payer: Commercial Managed Care - HMO | Attending: Anesthesiology | Admitting: Anesthesiology

## 2015-01-11 VITALS — BP 141/69 | HR 74 | Temp 98.3°F | Resp 16 | Ht 64.0 in | Wt 156.0 lb

## 2015-01-11 DIAGNOSIS — Z9889 Other specified postprocedural states: Secondary | ICD-10-CM | POA: Insufficient documentation

## 2015-01-11 DIAGNOSIS — G8929 Other chronic pain: Secondary | ICD-10-CM | POA: Insufficient documentation

## 2015-01-11 DIAGNOSIS — M545 Low back pain, unspecified: Secondary | ICD-10-CM

## 2015-01-11 DIAGNOSIS — M5416 Radiculopathy, lumbar region: Secondary | ICD-10-CM

## 2015-01-11 DIAGNOSIS — M4856XA Collapsed vertebra, not elsewhere classified, lumbar region, initial encounter for fracture: Secondary | ICD-10-CM | POA: Insufficient documentation

## 2015-01-11 DIAGNOSIS — M5136 Other intervertebral disc degeneration, lumbar region: Secondary | ICD-10-CM | POA: Insufficient documentation

## 2015-01-11 DIAGNOSIS — S32000A Wedge compression fracture of unspecified lumbar vertebra, initial encounter for closed fracture: Secondary | ICD-10-CM

## 2015-01-11 DIAGNOSIS — G545 Neuralgic amyotrophy: Secondary | ICD-10-CM | POA: Diagnosis present

## 2015-01-11 MED ORDER — TRAMADOL HCL 50 MG PO TABS: 50.0000 mg | ORAL_TABLET | Freq: Three times a day (TID) | ORAL | Status: DC

## 2015-01-11 NOTE — Progress Notes (Signed)
Safety precautions to be maintained throughout the outpatient stay will include: orient to surroundings, keep bed in low position, maintain call bell within reach at all times, provide assistance with transfer out of bed and ambulation.  
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
## 2015-01-11 NOTE — Patient Instructions (Signed)
You were given a script for Tramadol today. Epidural Steroid Injection Patient Information  Description: The epidural space surrounds the nerves as they exit the spinal cord.  In some patients, the nerves can be compressed and inflamed by a bulging disc or a tight spinal canal (spinal stenosis).  By injecting steroids into the epidural space, we can bring irritated nerves into direct contact with a potentially helpful medication.  These steroids act directly on the irritated nerves and can reduce swelling and inflammation which often leads to decreased pain.  Epidural steroids may be injected anywhere along the spine and from the neck to the low back depending upon the location of your pain.   After numbing the skin with local anesthetic (like Novocaine), a small needle is passed into the epidural space slowly.  You may experience a sensation of pressure while this is being done.  The entire block usually last less than 10 minutes.  Conditions which may be treated by epidural steroids:   Low back and leg pain  Neck and arm pain  Spinal stenosis  Post-laminectomy syndrome  Herpes zoster (shingles) pain  Pain from compression fractures  Preparation for the injection:  1. Do not eat any solid food or dairy products within 6 hours of your appointment.  2. You may drink clear liquids up to 2 hours before appointment.  Clear liquids include water, black coffee, juice or soda.  No milk or cream please. 3. You may take your regular medication, including pain medications, with a sip of water before your appointment  Diabetics should hold regular insulin (if taken separately) and take 1/2 normal NPH dos the morning of the procedure.  Carry some sugar containing items with you to your appointment. 4. A driver must accompany you and be prepared to drive you home after your procedure.  5. Bring all your current medications with your. 6. An IV may be inserted and sedation may be given at the  discretion of the physician.   7. A blood pressure cuff, EKG and other monitors will often be applied during the procedure.  Some patients may need to have extra oxygen administered for a short period. 8. You will be asked to provide medical information, including your allergies, prior to the procedure.  We must know immediately if you are taking blood thinners (like Coumadin/Warfarin)  Or if you are allergic to IV iodine contrast (dye). We must know if you could possible be pregnant.  Possible side-effects:  Bleeding from needle site  Infection (rare, may require surgery)  Nerve injury (rare)  Numbness & tingling (temporary)  Difficulty urinating (rare, temporary)  Spinal headache ( a headache worse with upright posture)  Light -headedness (temporary)  Pain at injection site (several days)  Decreased blood pressure (temporary)  Weakness in arm/leg (temporary)  Pressure sensation in back/neck (temporary)  Call if you experience:  Fever/chills associated with headache or increased back/neck pain.  Headache worsened by an upright position.  New onset weakness or numbness of an extremity below the injection site  Hives or difficulty breathing (go to the emergency room)  Inflammation or drainage at the infection site  Severe back/neck pain  Any new symptoms which are concerning to you  Please note:  Although the local anesthetic injected can often make your back or neck feel good for several hours after the injection, the pain will likely return.  It takes 3-7 days for steroids to work in the epidural space.  You may not notice any pain  relief for at least that one week.  If effective, we will often do a series of three injections spaced 3-6 weeks apart to maximally decrease your pain.  After the initial series, we generally will wait several months before considering a repeat injection of the same type.  If you have any questions, please call [phone] [facility]

## 2015-01-16 ENCOUNTER — Other Ambulatory Visit: Payer: Self-pay | Admitting: Orthopedic Surgery

## 2015-01-16 DIAGNOSIS — S22070A Wedge compression fracture of T9-T10 vertebra, initial encounter for closed fracture: Secondary | ICD-10-CM

## 2015-01-23 ENCOUNTER — Ambulatory Visit
Admission: RE | Admit: 2015-01-23 | Discharge: 2015-01-23 | Disposition: A | Discharge status: Home / Self Care | Payer: Commercial Managed Care - HMO | Source: Ambulatory Visit | Attending: Orthopedic Surgery | Admitting: Orthopedic Surgery

## 2015-01-23 DIAGNOSIS — M47814 Spondylosis without myelopathy or radiculopathy, thoracic region: Secondary | ICD-10-CM | POA: Diagnosis not present

## 2015-01-23 DIAGNOSIS — S22070A Wedge compression fracture of T9-T10 vertebra, initial encounter for closed fracture: Secondary | ICD-10-CM | POA: Diagnosis not present

## 2015-01-23 DIAGNOSIS — M4850XA Collapsed vertebra, not elsewhere classified, site unspecified, initial encounter for fracture: Secondary | ICD-10-CM | POA: Diagnosis present

## 2015-01-23 DIAGNOSIS — M4856XA Collapsed vertebra, not elsewhere classified, lumbar region, initial encounter for fracture: Secondary | ICD-10-CM | POA: Diagnosis not present

## 2015-01-31 ENCOUNTER — Telehealth: Payer: Self-pay | Admitting: Anesthesiology

## 2015-01-31 NOTE — Telephone Encounter (Signed)
Patient called asking about epid appt / has it been approved yet / had MRI

## 2015-02-03 ENCOUNTER — Encounter: Payer: Self-pay | Admitting: Anesthesiology

## 2015-02-03 ENCOUNTER — Ambulatory Visit: Payer: Commercial Managed Care - HMO | Attending: Anesthesiology | Admitting: Anesthesiology

## 2015-02-03 VITALS — BP 153/68 | HR 66 | Temp 98.5°F | Resp 16 | Ht 65.0 in | Wt 155.5 lb

## 2015-02-03 DIAGNOSIS — S32000A Wedge compression fracture of unspecified lumbar vertebra, initial encounter for closed fracture: Secondary | ICD-10-CM

## 2015-02-03 DIAGNOSIS — M545 Low back pain, unspecified: Secondary | ICD-10-CM

## 2015-02-03 DIAGNOSIS — X58XXXA Exposure to other specified factors, initial encounter: Secondary | ICD-10-CM | POA: Diagnosis not present

## 2015-02-03 DIAGNOSIS — M5416 Radiculopathy, lumbar region: Secondary | ICD-10-CM | POA: Diagnosis not present

## 2015-02-03 DIAGNOSIS — M5136 Other intervertebral disc degeneration, lumbar region: Secondary | ICD-10-CM | POA: Diagnosis not present

## 2015-02-03 MED ORDER — BUPIVACAINE HCL (PF) 0.25 % IJ SOLN
INTRAMUSCULAR | Status: AC
  Administered 2015-02-03: 14:00:00
  Filled 2015-02-03: qty 30

## 2015-02-03 MED ORDER — TRIAMCINOLONE ACETONIDE 40 MG/ML IJ SUSP
INTRAMUSCULAR | Status: AC
  Administered 2015-02-03: 14:00:00
  Filled 2015-02-03: qty 2

## 2015-02-03 MED ORDER — IOHEXOL 180 MG/ML  SOLN
INTRAMUSCULAR | Status: AC
  Filled 2015-02-03: qty 20

## 2015-02-03 MED ORDER — MIDAZOLAM HCL 5 MG/5ML IJ SOLN
INTRAMUSCULAR | Status: AC
  Administered 2015-02-03: 2 mg via INTRAVENOUS
  Filled 2015-02-03: qty 5

## 2015-02-03 MED ORDER — FENTANYL CITRATE (PF) 100 MCG/2ML IJ SOLN
INTRAMUSCULAR | Status: AC
  Administered 2015-02-03: 50 ug via INTRAVENOUS
  Filled 2015-02-03: qty 2

## 2015-02-03 NOTE — Patient Instructions (Addendum)
We will need to get [facility] approval before we can schedule your appointment for another epidural injection.    Pain Management Discharge Instructions  General Discharge Instructions :  If you need to reach your doctor call: Monday-Friday 8:00 am - 4:00 pm at [phone] or toll free [phone].  After clinic hours [phone] to have operator reach doctor.  Bring all of your medication bottles to all your appointments in the pain clinic.  To cancel or reschedule your appointment with Pain Management please remember to call 24 hours in advance to avoid a fee.  Refer to the educational materials which you have been given on: General Risks, I had my Procedure. Discharge Instructions, Post Sedation.  Post Procedure Instructions:  The drugs you were given will stay in your system until tomorrow, so for the next 24 hours you should not drive, make any legal decisions or drink any alcoholic beverages.  You may eat anything you prefer, but it is better to start with liquids then soups and crackers, and gradually work up to solid foods.  Please notify your doctor immediately if you have any unusual bleeding, trouble breathing or pain that is not related to your normal pain.  Depending on the type of procedure that was done, some parts of your body may feel week and/or numb.  This usually clears up by tonight or the next day.  Walk with the use of an assistive device or accompanied by an adult for the 24 hours.  You may use ice on the affected area for the first 24 hours.  Put ice in a Ziploc bag and cover with a towel and place against area 15 minutes on 15 minutes off.  You may switch to heat after 24 hours.    Epidural Steroid Injection An epidural steroid injection is given to relieve pain in your neck, back, or legs that is caused by the irritation or swelling of a nerve root. This procedure involves injecting a steroid and numbing medicine (anesthetic) into the epidural space. The  epidural space is the space between the outer covering of your spinal cord and the bones that form your backbone (vertebra).  LET [facility] CARE PROVIDER KNOW ABOUT:   Any allergies you have.  All medicines you are taking, including vitamins, herbs, eye drops, creams, and over-the-counter medicines such as aspirin.  Previous problems you or members of your family have had with the use of anesthetics.  Any blood disorders or blood clotting disorders you have.  Previous surgeries you have had.  Medical conditions you have. RISKS AND COMPLICATIONS Generally, this is a safe procedure. However, as with any procedure, complications can occur. Possible complications of epidural steroid injection include:  Headache.  Bleeding.  Infection.  Allergic reaction to the medicines.  Damage to your nerves. The response to this procedure depends on the underlying cause of the pain and its duration. People who have long-term (chronic) pain are less likely to benefit from epidural steroids than are those people whose pain comes on strong and suddenly. BEFORE THE PROCEDURE   Ask your health care provider about changing or stopping your regular medicines. You may be advised to stop taking blood-thinning medicines a few days before the procedure.  You may be given medicines to reduce anxiety.  Arrange for someone to take you home after the procedure. PROCEDURE   You will remain awake during the procedure. You may receive medicine to make you relaxed.  You will be asked to lie on your stomach.  The injection site will be cleaned.  The injection site will be numbed with a medicine (local anesthetic).  A needle will be injected through your skin into the epidural space.  Your health care provider will use an X-ray machine to ensure that the steroid is delivered closest to the affected nerve. You may have minimal discomfort at this time.  Once the needle is in the right position, the local  anesthetic and the steroid will be injected into the epidural space.  The needle will then be removed and a bandage will be applied to the injection site. AFTER THE PROCEDURE   You may be monitored for a short time before you go home.  You may feel weakness or numbness in your arm or leg, which disappears within hours.  You may be allowed to eat, drink, and take your regular medicine.  You may have soreness at the site of the injection. Document Released: 09/10/2007 Document Revised: 02/03/2013 Document Reviewed: 11/20/2012 [facility] Patient Information 2015 [place], [place]. This information is not intended to replace advice given to you by your health care provider. Make sure you discuss any questions you have with your health care provider.

## 2015-02-03 NOTE — Progress Notes (Signed)
Subjective:    Patient ID: Cheyenne Grimes, female    DOB: 11-07-1950, 64 y.o.   MRN: [id number]  HPI    Review of Systems     Objective:   Physical Exam        Assessment & Plan:    Left lumbar transforaminal epidural steroid injection at L4 L5 S1  Date of procedure 02/03/2015  Preoperative diagnosis Diagnoses     Back pain at L4-L5 level - Primary    ICD-9-CM: 724.2 ICD-10-CM: M54.5    DDD (degenerative disc disease), lumbar     ICD-9-CM: 722.52 ICD-10-CM: M51.36    Lumbar compression fracture, closed, initial encounter     ICD-9-CM: 805.4 ICD-10-CM: S32.000A    Lumbar radiculopathy     ICD-9-CM: 724.4 ICD-      Postoperative diagnosis Same  Procedure 1 Left lumbar transforaminal epidural steroid injection at L4 for L5-S1 2 no epidurography was performed because patient is allergic to contrast dye 3 fluoroscopic guidance  Surgeon Tod Persia M.D.  Anesthesia Mac anesthesia administered by the nurse and staff under my direction  Informed consent was obtained and the patient appeared to accept and understand the benefits and risks of this procedure Timeout was done in conjunction with the nursing staff to verified the side of the procedure  Description of procedure The patient was taken to the procedure room and placed in the prone position. Intravenous sedation and Mac anesthesia was administered by the nurse and staff under my direction  After appropriate sedation the lumbar sacral area was prepped with Betadine and the patient was fully draped Fluoroscopic view of the lumbar spine was done and the pedicle on the left side was visualized and the skin over the 6:00 position of the pedicle on the left side of L4 and L5 was infiltrated with 3 cc of 1% lidocaine using a 25-gauge needle. Adequate visualization of the pedicle was obtained by using an oblique fluoroscopic view. Then a 22-gauge 3-1/2 inch needle was introduced through the  skin and directed towards the 6:00 position of the pedicles at L4 and L5. The needles were advanced into the superior medial portion of the foramen at L4 and L5. Then a lateral view was utilized to ensure optimal needle position and depth in the foramen  Epidurogram with interpretation No Epidurography was done because the patient was allergic to contrast media.  Transforaminal epidural steroid injection. Again aspirations were negative for blood and CSF and other body fluids.  Then 1-1/2 cc of 0.25% bupivacaine and 25 mg of Kenalog were injected into L4 and L5 foramen  Then the right S1 foramen was visualized using an oblique fluoroscopic view. The target area was the 2:00 o'clock position and that target area was used for local infiltration and needle placement.. The target area was infiltrated with 3 cc of 1% lidocaine using a 25-gauge needle. Then a 3 inch 22-gauge needle was inserted into the left S1 foramen. A lateral fluoroscopic view was used to confirm needle positioning. No contrast was used to verified needle positioning because the patient was allergic to contrast media Then 1.5 cc of bupivacaine 0.25% and 25 mg of Kenalog were injected into the left S1 foramen. The needle was removed and adequate hemostasis was established. There were no complications  Patient tolerated procedure quite well. The needles were removed and vital signs were stable. Patient was taken to the recovery room in satisfactory condition where she was observed for approximately 45 minutes and subsequently discharged home. We'll  plan to see the patient indicated for a second procedure in the next 3 weeks.  Tod Persia M.D.

## 2015-02-03 NOTE — Progress Notes (Signed)
Safety precautions to be maintained throughout the outpatient stay will include: orient to surroundings, keep bed in low position, maintain call bell within reach at all times, provide assistance with transfer out of bed and ambulation.  

## 2015-02-06 ENCOUNTER — Telehealth: Payer: Self-pay | Admitting: *Deleted

## 2015-02-06 NOTE — Telephone Encounter (Signed)
No problems post procedure phone call. 

## 2015-02-24 ENCOUNTER — Ambulatory Visit: Payer: Commercial Managed Care - HMO | Admitting: Anesthesiology

## 2015-03-29 DIAGNOSIS — G5622 Lesion of ulnar nerve, left upper limb: Secondary | ICD-10-CM | POA: Insufficient documentation

## 2015-04-26 ENCOUNTER — Encounter: Payer: Self-pay | Admitting: Anesthesiology

## 2015-04-26 ENCOUNTER — Ambulatory Visit: Payer: Commercial Managed Care - HMO | Attending: Anesthesiology | Admitting: Anesthesiology

## 2015-04-26 VITALS — BP 153/69 | HR 85 | Temp 98.2°F | Resp 18 | Ht 66.0 in | Wt 157.0 lb

## 2015-04-26 DIAGNOSIS — G8929 Other chronic pain: Secondary | ICD-10-CM | POA: Diagnosis not present

## 2015-04-26 DIAGNOSIS — M545 Low back pain, unspecified: Secondary | ICD-10-CM

## 2015-04-26 DIAGNOSIS — M5416 Radiculopathy, lumbar region: Secondary | ICD-10-CM | POA: Diagnosis not present

## 2015-04-26 DIAGNOSIS — M5136 Other intervertebral disc degeneration, lumbar region: Secondary | ICD-10-CM | POA: Diagnosis not present

## 2015-04-26 DIAGNOSIS — M5137 Other intervertebral disc degeneration, lumbosacral region: Secondary | ICD-10-CM

## 2015-04-26 MED ORDER — TRAMADOL HCL 50 MG PO TABS: 50.0000 mg | ORAL_TABLET | Freq: Three times a day (TID) | ORAL | Status: DC

## 2015-04-26 MED ORDER — TRAMADOL HCL 50 MG PO TABS
50.0000 mg | ORAL_TABLET | Freq: Four times a day (QID) | ORAL | Status: DC | PRN
Start: 2015-04-26 — End: 2018-07-30

## 2015-04-26 NOTE — Patient Instructions (Signed)
Epidural Steroid Injection Patient Information  Description: The epidural space surrounds the nerves as they exit the spinal cord.  In some patients, the nerves can be compressed and inflamed by a bulging disc or a tight spinal canal (spinal stenosis).  By injecting steroids into the epidural space, we can bring irritated nerves into direct contact with a potentially helpful medication.  These steroids act directly on the irritated nerves and can reduce swelling and inflammation which often leads to decreased pain.  Epidural steroids may be injected anywhere along the spine and from the neck to the low back depending upon the location of your pain.   After numbing the skin with local anesthetic (like Novocaine), a small needle is passed into the epidural space slowly.  You may experience a sensation of pressure while this is being done.  The entire block usually last less than 10 minutes.  Conditions which may be treated by epidural steroids:   Low back and leg pain  Neck and arm pain  Spinal stenosis  Post-laminectomy syndrome  Herpes zoster (shingles) pain  Pain from compression fractures  Preparation for the injection:  1. Do not eat any solid food or dairy products within 6 hours of your appointment.  2. You may drink clear liquids up to 2 hours before appointment.  Clear liquids include water, black coffee, juice or soda.  No milk or cream please. 3. You may take your regular medication, including pain medications, with a sip of water before your appointment  Diabetics should hold regular insulin (if taken separately) and take 1/2 normal NPH dos the morning of the procedure.  Carry some sugar containing items with you to your appointment. 4. A driver must accompany you and be prepared to drive you home after your procedure.  5. Bring all your current medications with your. 6. An IV may be inserted and sedation may be given at the discretion of the physician.   7. A blood pressure  cuff, EKG and other monitors will often be applied during the procedure.  Some patients may need to have extra oxygen administered for a short period. 8. You will be asked to provide medical information, including your allergies, prior to the procedure.  We must know immediately if you are taking blood thinners (like Coumadin/Warfarin)  Or if you are allergic to IV iodine contrast (dye). We must know if you could possible be pregnant.  Possible side-effects:  Bleeding from needle site  Infection (rare, may require surgery)  Nerve injury (rare)  Numbness & tingling (temporary)  Difficulty urinating (rare, temporary)  Spinal headache ( a headache worse with upright posture)  Light -headedness (temporary)  Pain at injection site (several days)  Decreased blood pressure (temporary)  Weakness in arm/leg (temporary)  Pressure sensation in back/neck (temporary)  Call if you experience:  Fever/chills associated with headache or increased back/neck pain.  Headache worsened by an upright position.  New onset weakness or numbness of an extremity below the injection site  Hives or difficulty breathing (go to the emergency room)  Inflammation or drainage at the infection site  Severe back/neck pain  Any new symptoms which are concerning to you  Please note:  Although the local anesthetic injected can often make your back or neck feel good for several hours after the injection, the pain will likely return.  It takes 3-7 days for steroids to work in the epidural space.  You may not notice any pain relief for at least that one week.    If effective, we will often do a series of three injections spaced 3-6 weeks apart to maximally decrease your pain.  After the initial series, we generally will wait several months before considering a repeat injection of the same type.  If you have any questions, please call [phone] [facility] 

## 2015-04-26 NOTE — Progress Notes (Signed)
Safety precautions to be maintained throughout the outpatient stay will include: orient to surroundings, keep bed in low position, maintain call bell within reach at all times, provide assistance with transfer out of bed and ambulation.  

## 2015-04-27 ENCOUNTER — Telehealth: Payer: Self-pay

## 2015-04-27 NOTE — Telephone Encounter (Signed)
Patient says meds that Dr. Starling MannsParris prescribed her are not working.. Pt is scheduled to come in tomorrow

## 2015-04-27 NOTE — Progress Notes (Signed)
   Subjective:    Patient ID: Cheyenne Grimes, female    DOB: 03/10/1951, 64 y.o.   MRN: [id number]  HPI  This patient return to the clinic today following her last procedure indicating that she obtained some modest improvement  Today her subjective pain intensity rating is 60%  Last week she had a left lumbar transforaminal epidural steroid injection at L4-L5 and S1  She indicated that whereas the improvement is quite welcome she still has some residual pain  She explained that she has difficultand I explained to her due to difficparticularly the legal difficulties of the left and had drive following an interventional procedures like this one    Review of Systems  Constitutional: Negative.   HENT: Negative.   Eyes: Negative.   Respiratory: Negative.   Cardiovascular: Negative.   Gastrointestinal: Negative.   Endocrine: Negative.   Genitourinary: Negative.   Musculoskeletal: Positive for myalgias, back pain, arthralgias and gait problem. Negative for joint swelling, neck pain and neck stiffness.  Skin: Negative.   Allergic/Immunologic: Negative.   Neurological: Negative.  Negative for facial asymmetry.  Hematological: Negative.   Psychiatric/Behavioral: Negative.        Objective:   Physical Exam  Constitutional: She appears well-developed and well-nourished. No distress.  Cardiovascular:  Blood pressure was 153/69  millimeters of mercury Pulse was 85 bpm Equal and regular Heart sounds 1 and 2 were heard in all areas There were no audible murmurs Respirations were 18 breaths per minute SPO2 was 93% Chest was clinically clear There were no adventitious sounds Abdomen is soft and nontender There is no palpable organomegaly There are no significant lymphadenopathy Pupils were equal and reactive Radial nerves were intact No new neurological or musculoskeletal findings  Genitourinary:  Genitourinary examination was deferred  Musculoskeletal:  The patient was tender in the  L4-L5 paraspinous muscles Range of motion was slightly decreased in the lower extremity There were  no new neurological or musculoskeletal findings   Skin: She is not diaphoretic.  Vitals reviewed.         Assessment & Plan:    Assessment 1 chronic low back pain To lumbar degenerative disc disease 3 lumbar radiculopathy   Plan of management We will plan to repeat the procedure and perform a left lumbar transforaminal epidural steroid injection at L4-L5 and S1 We'll plan to do it for her next week I would also continue her on tramadol 50mg  TID and given her 30 tablets   Established patient     Level 2   Tod PersiaWinston Pierre Cumpton M.D.

## 2015-04-27 NOTE — Telephone Encounter (Signed)
Called patient and instructed to keep appt tomorrow and speak with Dr Starling MannsParris about it.

## 2015-04-28 ENCOUNTER — Encounter: Payer: Self-pay | Admitting: Anesthesiology

## 2015-04-28 ENCOUNTER — Ambulatory Visit: Payer: Commercial Managed Care - HMO | Attending: Anesthesiology | Admitting: Anesthesiology

## 2015-04-28 ENCOUNTER — Ambulatory Visit: Payer: Commercial Managed Care - HMO | Admitting: Anesthesiology

## 2015-04-28 VITALS — BP 133/47 | HR 72 | Temp 98.6°F | Resp 16 | Ht 66.0 in | Wt 157.0 lb

## 2015-04-28 DIAGNOSIS — G8929 Other chronic pain: Secondary | ICD-10-CM | POA: Insufficient documentation

## 2015-04-28 DIAGNOSIS — M545 Low back pain, unspecified: Secondary | ICD-10-CM

## 2015-04-28 DIAGNOSIS — M5116 Intervertebral disc disorders with radiculopathy, lumbar region: Secondary | ICD-10-CM | POA: Diagnosis present

## 2015-04-28 DIAGNOSIS — M5416 Radiculopathy, lumbar region: Secondary | ICD-10-CM

## 2015-04-28 DIAGNOSIS — M5137 Other intervertebral disc degeneration, lumbosacral region: Secondary | ICD-10-CM

## 2015-04-28 MED ORDER — TRIAMCINOLONE ACETONIDE 40 MG/ML IJ SUSP
INTRAMUSCULAR | Status: AC
  Administered 2015-04-28: 80 mg
  Filled 2015-04-28: qty 2

## 2015-04-28 MED ORDER — BUPIVACAINE HCL (PF) 0.25 % IJ SOLN
INTRAMUSCULAR | Status: AC
  Administered 2015-04-28: 30 mL
  Filled 2015-04-28: qty 30

## 2015-04-28 MED ORDER — MIDAZOLAM HCL 5 MG/5ML IJ SOLN
INTRAMUSCULAR | Status: AC
  Administered 2015-04-28: 2 mg via INTRAVENOUS
  Filled 2015-04-28: qty 5

## 2015-04-28 MED ORDER — FENTANYL CITRATE (PF) 100 MCG/2ML IJ SOLN
INTRAMUSCULAR | Status: AC
  Administered 2015-04-28: 50 ug via INTRAVENOUS
  Filled 2015-04-28: qty 2

## 2015-04-28 NOTE — Patient Instructions (Signed)
Epidural Steroid Injection An epidural steroid injection is given to relieve pain in your neck, back, or legs that is caused by the irritation or swelling of a nerve root. This procedure involves injecting a steroid and numbing medicine (anesthetic) into the epidural space. The epidural space is the space between the outer covering of your spinal cord and the bones that form your backbone (vertebra).  LET YOUR HEALTH CARE PROVIDER KNOW ABOUT:   Any allergies you have.  All medicines you are taking, including vitamins, herbs, eye drops, creams, and over-the-counter medicines such as aspirin.  Previous problems you or members of your family have had with the use of anesthetics.  Any blood disorders or blood clotting disorders you have.  Previous surgeries you have had.  Medical conditions you have. RISKS AND COMPLICATIONS Generally, this is a safe procedure. However, as with any procedure, complications can occur. Possible complications of epidural steroid injection include:  Headache.  Bleeding.  Infection.  Allergic reaction to the medicines.  Damage to your nerves. The response to this procedure depends on the underlying cause of the pain and its duration. People who have long-term (chronic) pain are less likely to benefit from epidural steroids than are those people whose pain comes on strong and suddenly. BEFORE THE PROCEDURE   Ask your health care provider about changing or stopping your regular medicines. You may be advised to stop taking blood-thinning medicines a few days before the procedure.  You may be given medicines to reduce anxiety.  Arrange for someone to take you home after the procedure. PROCEDURE   You will remain awake during the procedure. You may receive medicine to make you relaxed.  You will be asked to lie on your stomach.  The injection site will be cleaned.  The injection site will be numbed with a medicine (local anesthetic).  A needle will be  injected through your skin into the epidural space.  Your health care provider will use an X-ray machine to ensure that the steroid is delivered closest to the affected nerve. You may have minimal discomfort at this time.  Once the needle is in the right position, the local anesthetic and the steroid will be injected into the epidural space.  The needle will then be removed and a bandage will be applied to the injection site. AFTER THE PROCEDURE   You may be monitored for a short time before you go home.  You may feel weakness or numbness in your arm or leg, which disappears within hours.  You may be allowed to eat, drink, and take your regular medicine.  You may have soreness at the site of the injection.   This information is not intended to replace advice given to you by your health care provider. Make sure you discuss any questions you have with your health care provider.   Document Released: 09/10/2007 Document Revised: 02/03/2013 Document Reviewed: 11/20/2012 Elsevier Interactive Patient Education 2016 [employer] Pain Management Discharge Instructions  General Discharge Instructions :  If you need to reach your doctor call: Monday-Friday 8:00 am - 4:00 pm at [phone] or toll free [phone].  After clinic hours [phone] to have operator reach doctor.  Bring all of your medication bottles to all your appointments in the pain clinic.  To cancel or reschedule your appointment with Pain Management please remember to call 24 hours in advance to avoid a fee.  Refer to the educational materials which you have been given on: General Risks, I had my Procedure.   Discharge Instructions, Post Sedation.  Post Procedure Instructions:  The drugs you were given will stay in your system until tomorrow, so for the next 24 hours you should not drive, make any legal decisions or drink any alcoholic beverages.  You may eat anything you prefer, but it is better to start with  liquids then soups and crackers, and gradually work up to solid foods.  Please notify your doctor immediately if you have any unusual bleeding, trouble breathing or pain that is not related to your normal pain.  Depending on the type of procedure that was done, some parts of your body may feel week and/or numb.  This usually clears up by tonight or the next day.  Walk with the use of an assistive device or accompanied by an adult for the 24 hours.  You may use ice on the affected area for the first 24 hours.  Put ice in a Ziploc bag and cover with a towel and place against area 15 minutes on 15 minutes off.  You may switch to heat after 24 hours. 

## 2015-04-28 NOTE — Procedures (Signed)
Date of procedure: 04/28/2015  Preoperative Diagnosis:  1 chronic low back pain 2 lumbar degenerative disc disease 3 lumbar radiculopathy  Postoperative Diagnosis:  Same.  Procedure: 1. left Lumbar transforaminal epidural steroid injection at L4-L5 and S1. 2. Epidurogram with interpretation. 3. Fluoroscopic guidance.  Surgeon: Tod PersiaWinston Reford Olliff, MD  Anesthesia: MAC anesthesia by the nurse and staff under my direction  Informed consent was obtained and the patient appeared to accept and understand the risks of this procedure.  Pre procedure Comments:  This patient is allergic to all iodine drugs and all IV dyes and therefore we will not be using any contrast including Omnipaque to do this procedure We will rely on accurate needle placements to perform the transforaminal epidural steroid injection.  Description of Procedure:  The patient was taken to the operating room and placed in the prone position.  Intravenous sedation and MAC anesthesia was administered by the nursing staff under my direction.  After appropriate sedation, the lumbosacral area was prepped with Betadine.  Then the patient was fully draped.  Fluoroscopic view of the lumbar spine was done and the pedicle on the left side was visualized.  The skin over the 6 o'clock position of the pedicle on the left was infiltrated with 3 cc of 1% Lidocaine using a 25 gauge needle.  Adequate visualization of the pedicle was obtained by using an oblique fluoroscopic view.  Then a 22 gauge 3 1/2 spinal needle was introduced through the skin and directed towards the 6 o'clock position of  The pedicles at L 4  And L5.  Then a lateral view was utilized to ensure optimal needle position and depth in the foramen.   Epidurogram with Interpretation: Epidurograms were not performed since the patient was allergic to contrast media  After negative aspiration, the visualization of the contrast media was also to ensure adequate spread of the  injectate into the presumed affected areas.  No intravascular or intrathecal spread was observed at any level and there was no corresponding paresthesias upon injection.  The needle positioning was confirmed in both AP oblique and lateral views. This was required since contrast was not used and epidurograms were not done  Transforaminal Epidural Steroid Injection:  Again aspirations were negative for blood, CSF or other body fluids.  Then 1.5 cc of 0.25% Bupivacaine and 25 mg of Kenalog was injected in the L 4 and L5  foramen.  The left S1 foramen was visualized using an oblique fluoroscopic view.   The target area was the 10 o'clock/2 o'clock and that target area was used for local infiltration and for needle placement.   The target area was infiltrated with 3 cc of 1% Lidocaine using a 25 gauge needle.   Then a 3-inch 22 gauge needle was inserted into the left S1 foramen.  A Bupivacaine 0.25% 1.5 cc and 25 mg of Kenalog were injected into the left S1.   The needle was removed and adequate hemostasis was established.  Additional Comments: This procedure was done under fluoroscopic guidance Fluoroscopic time was 0.5 seconds There was 6 fluoroscopic frames MG Y was 6.7  The patient tolerated the procedures quite well.   The needles were removed.   Adequate hemostasis was established and vital signs were stable.   The patient was taken to the recovery room in satisfactory condition where the patient was observed and subsequently discharged home.  Will follow up in the clinic in the next week or thereabout.    Tod PersiaWinston Ketsia Linebaugh M.D.

## 2015-04-28 NOTE — Progress Notes (Signed)
Safety precautions to be maintained throughout the outpatient stay will include: orient to surroundings, keep bed in low position, maintain call bell within reach at all times, provide assistance with transfer out of bed and ambulation.  

## 2015-05-01 ENCOUNTER — Telehealth: Payer: Self-pay | Admitting: *Deleted

## 2015-05-01 NOTE — Telephone Encounter (Signed)
Patient verbalizes no problems or concerns from procedure on Friday, 04/28/2015

## 2015-05-17 ENCOUNTER — Ambulatory Visit: Payer: Commercial Managed Care - HMO | Admitting: Anesthesiology

## 2015-09-22 DIAGNOSIS — S42402G Unspecified fracture of lower end of left humerus, subsequent encounter for fracture with delayed healing: Secondary | ICD-10-CM | POA: Insufficient documentation

## 2015-10-23 ENCOUNTER — Telehealth: Payer: Self-pay | Admitting: Pain Medicine

## 2015-10-23 NOTE — Telephone Encounter (Signed)
Patient notified that Dr Laban EmperorNaveira is not doing Kyphoplasty at this time.

## 2015-10-23 NOTE — Telephone Encounter (Signed)
Patient has fallen and hurt her back again, wants to know if she can get a kyphoplasty from Dr. Laban EmperorNaveira, has [facility]

## 2015-10-25 ENCOUNTER — Other Ambulatory Visit: Payer: Self-pay | Admitting: Unknown Physician Specialty

## 2015-10-25 ENCOUNTER — Ambulatory Visit
Admission: RE | Admit: 2015-10-25 | Discharge: 2015-10-25 | Disposition: A | Discharge status: Home / Self Care | Payer: Medicare PPO | Source: Ambulatory Visit | Attending: Unknown Physician Specialty | Admitting: Unknown Physician Specialty

## 2015-10-25 DIAGNOSIS — M4854XA Collapsed vertebra, not elsewhere classified, thoracic region, initial encounter for fracture: Secondary | ICD-10-CM | POA: Insufficient documentation

## 2015-10-25 DIAGNOSIS — S22040A Wedge compression fracture of fourth thoracic vertebra, initial encounter for closed fracture: Secondary | ICD-10-CM

## 2015-10-27 ENCOUNTER — Ambulatory Visit: Payer: Commercial Managed Care - HMO | Admitting: Anesthesiology

## 2015-10-31 ENCOUNTER — Ambulatory Visit: Payer: Medicare PPO | Admitting: Anesthesiology

## 2015-10-31 ENCOUNTER — Ambulatory Visit: Payer: Medicare PPO

## 2015-10-31 ENCOUNTER — Encounter: Payer: Self-pay | Admitting: *Deleted

## 2015-10-31 ENCOUNTER — Ambulatory Visit
Admission: RE | Admit: 2015-10-31 | Discharge: 2015-10-31 | Disposition: A | Discharge status: Home / Self Care | Payer: Medicare PPO | Source: Ambulatory Visit | Attending: Orthopedic Surgery | Admitting: Orthopedic Surgery

## 2015-10-31 ENCOUNTER — Encounter
Admission: RE | Disposition: A | Discharge status: Home / Self Care | Payer: Self-pay | Source: Ambulatory Visit | Attending: Orthopedic Surgery

## 2015-10-31 DIAGNOSIS — S22050A Wedge compression fracture of T5-T6 vertebra, initial encounter for closed fracture: Secondary | ICD-10-CM | POA: Diagnosis present

## 2015-10-31 DIAGNOSIS — Z79899 Other long term (current) drug therapy: Secondary | ICD-10-CM | POA: Diagnosis not present

## 2015-10-31 DIAGNOSIS — D649 Anemia, unspecified: Secondary | ICD-10-CM | POA: Diagnosis not present

## 2015-10-31 DIAGNOSIS — K5900 Constipation, unspecified: Secondary | ICD-10-CM | POA: Insufficient documentation

## 2015-10-31 DIAGNOSIS — Z880 Allergy status to penicillin: Secondary | ICD-10-CM | POA: Diagnosis not present

## 2015-10-31 DIAGNOSIS — F419 Anxiety disorder, unspecified: Secondary | ICD-10-CM | POA: Insufficient documentation

## 2015-10-31 DIAGNOSIS — M549 Dorsalgia, unspecified: Secondary | ICD-10-CM | POA: Diagnosis not present

## 2015-10-31 DIAGNOSIS — K219 Gastro-esophageal reflux disease without esophagitis: Secondary | ICD-10-CM | POA: Insufficient documentation

## 2015-10-31 DIAGNOSIS — M810 Age-related osteoporosis without current pathological fracture: Secondary | ICD-10-CM | POA: Diagnosis not present

## 2015-10-31 DIAGNOSIS — Z86010 Personal history of colonic polyps: Secondary | ICD-10-CM | POA: Diagnosis not present

## 2015-10-31 DIAGNOSIS — M5481 Occipital neuralgia: Secondary | ICD-10-CM | POA: Diagnosis not present

## 2015-10-31 DIAGNOSIS — G8929 Other chronic pain: Secondary | ICD-10-CM | POA: Insufficient documentation

## 2015-10-31 DIAGNOSIS — R05 Cough: Secondary | ICD-10-CM | POA: Insufficient documentation

## 2015-10-31 DIAGNOSIS — Z9049 Acquired absence of other specified parts of digestive tract: Secondary | ICD-10-CM | POA: Insufficient documentation

## 2015-10-31 DIAGNOSIS — Y9389 Activity, other specified: Secondary | ICD-10-CM | POA: Insufficient documentation

## 2015-10-31 DIAGNOSIS — X509XXA Other and unspecified overexertion or strenuous movements or postures, initial encounter: Secondary | ICD-10-CM | POA: Diagnosis not present

## 2015-10-31 DIAGNOSIS — Z9841 Cataract extraction status, right eye: Secondary | ICD-10-CM | POA: Diagnosis not present

## 2015-10-31 DIAGNOSIS — J449 Chronic obstructive pulmonary disease, unspecified: Secondary | ICD-10-CM | POA: Diagnosis not present

## 2015-10-31 DIAGNOSIS — Z8711 Personal history of peptic ulcer disease: Secondary | ICD-10-CM | POA: Insufficient documentation

## 2015-10-31 DIAGNOSIS — Z91041 Radiographic dye allergy status: Secondary | ICD-10-CM | POA: Insufficient documentation

## 2015-10-31 DIAGNOSIS — Z9842 Cataract extraction status, left eye: Secondary | ICD-10-CM | POA: Diagnosis not present

## 2015-10-31 DIAGNOSIS — M797 Fibromyalgia: Secondary | ICD-10-CM | POA: Insufficient documentation

## 2015-10-31 DIAGNOSIS — X500XXA Overexertion from strenuous movement or load, initial encounter: Secondary | ICD-10-CM | POA: Insufficient documentation

## 2015-10-31 DIAGNOSIS — Z96652 Presence of left artificial knee joint: Secondary | ICD-10-CM | POA: Diagnosis not present

## 2015-10-31 DIAGNOSIS — F329 Major depressive disorder, single episode, unspecified: Secondary | ICD-10-CM | POA: Diagnosis not present

## 2015-10-31 DIAGNOSIS — Z7951 Long term (current) use of inhaled steroids: Secondary | ICD-10-CM | POA: Insufficient documentation

## 2015-10-31 DIAGNOSIS — Z419 Encounter for procedure for purposes other than remedying health state, unspecified: Secondary | ICD-10-CM

## 2015-10-31 DIAGNOSIS — F17210 Nicotine dependence, cigarettes, uncomplicated: Secondary | ICD-10-CM | POA: Insufficient documentation

## 2015-10-31 HISTORY — PX: KYPHOPLASTY: SHX5884

## 2015-10-31 SURGERY — KYPHOPLASTY
Anesthesia: Monitor Anesthesia Care | Wound class: Clean

## 2015-10-31 MED ORDER — FENTANYL CITRATE (PF) 100 MCG/2ML IJ SOLN
INTRAMUSCULAR | Status: AC
  Filled 2015-10-31: qty 2

## 2015-10-31 MED ORDER — PHENYLEPHRINE HCL 10 MG/ML IJ SOLN
INTRAMUSCULAR | Status: DC | PRN
  Administered 2015-10-31: 100 ug via INTRAVENOUS
  Administered 2015-10-31: 50 ug via INTRAVENOUS
  Administered 2015-10-31: 100 ug via INTRAVENOUS

## 2015-10-31 MED ORDER — BUPIVACAINE-EPINEPHRINE (PF) 0.5% -1:200000 IJ SOLN
INTRAMUSCULAR | Status: AC
  Filled 2015-10-31: qty 30

## 2015-10-31 MED ORDER — IOPAMIDOL (ISOVUE-M 200) INJECTION 41%
INTRAMUSCULAR | Status: AC
  Filled 2015-10-31: qty 10

## 2015-10-31 MED ORDER — LIDOCAINE HCL 1 % IJ SOLN
INTRAMUSCULAR | Status: DC | PRN
  Administered 2015-10-31: 10 mL

## 2015-10-31 MED ORDER — LACTATED RINGERS IV SOLN
INTRAVENOUS | Status: DC
  Administered 2015-10-31: 15:00:00 via INTRAVENOUS

## 2015-10-31 MED ORDER — HYDROCODONE-ACETAMINOPHEN 5-325 MG PO TABS
ORAL_TABLET | ORAL | Status: AC
  Filled 2015-10-31: qty 1

## 2015-10-31 MED ORDER — FAMOTIDINE 20 MG PO TABS
20.0000 mg | ORAL_TABLET | Freq: Once | ORAL | Status: AC
  Administered 2015-10-31: 20 mg via ORAL

## 2015-10-31 MED ORDER — FENTANYL CITRATE (PF) 100 MCG/2ML IJ SOLN
25.0000 ug | INTRAMUSCULAR | Status: AC | PRN
  Administered 2015-10-31 (×6): 25 ug via INTRAVENOUS

## 2015-10-31 MED ORDER — BUPIVACAINE-EPINEPHRINE (PF) 0.5% -1:200000 IJ SOLN
INTRAMUSCULAR | Status: DC | PRN
  Administered 2015-10-31: 20 mL

## 2015-10-31 MED ORDER — FAMOTIDINE 20 MG PO TABS
ORAL_TABLET | ORAL | Status: AC
Start: 2015-10-31 — End: 2015-10-31
  Administered 2015-10-31: 20 mg via ORAL
  Filled 2015-10-31: qty 1

## 2015-10-31 MED ORDER — LIDOCAINE HCL (PF) 1 % IJ SOLN
INTRAMUSCULAR | Status: AC
  Filled 2015-10-31: qty 30

## 2015-10-31 MED ORDER — ONDANSETRON HCL 4 MG/2ML IJ SOLN: 4.0000 mg | Freq: Once | INTRAMUSCULAR | Status: DC | PRN

## 2015-10-31 MED ORDER — CLINDAMYCIN PHOSPHATE 900 MG/50ML IV SOLN: 900.0000 mg | Freq: Once | INTRAVENOUS | Status: DC

## 2015-10-31 MED ORDER — HYDROCODONE-ACETAMINOPHEN 5-325 MG PO TABS
1.0000 | ORAL_TABLET | Freq: Four times a day (QID) | ORAL | Status: AC | PRN
  Administered 2015-10-31: 1 via ORAL

## 2015-10-31 MED ORDER — FENTANYL CITRATE (PF) 100 MCG/2ML IJ SOLN
INTRAMUSCULAR | Status: DC | PRN
  Administered 2015-10-31: 100 ug via INTRAVENOUS

## 2015-10-31 MED ORDER — CLINDAMYCIN PHOSPHATE 900 MG/50ML IV SOLN
INTRAVENOUS | Status: AC
  Administered 2015-10-31: 900 mg via INTRAVENOUS
  Filled 2015-10-31: qty 50

## 2015-10-31 MED ORDER — IOPAMIDOL (ISOVUE-M 200) INJECTION 41%
INTRAMUSCULAR | Status: DC | PRN
  Administered 2015-10-31: 20 mL

## 2015-10-31 MED ORDER — PROPOFOL 500 MG/50ML IV EMUL
INTRAVENOUS | Status: DC | PRN
  Administered 2015-10-31: 75 ug/kg/min via INTRAVENOUS

## 2015-10-31 MED ORDER — BUPIVACAINE HCL (PF) 0.5 % IJ SOLN
INTRAMUSCULAR | Status: AC
  Filled 2015-10-31: qty 30

## 2015-10-31 MED ORDER — HYDROCODONE-ACETAMINOPHEN 5-325 MG PO TABS: 1.0000 | ORAL_TABLET | Freq: Four times a day (QID) | ORAL | Status: DC | PRN

## 2015-10-31 MED ORDER — MIDAZOLAM HCL 2 MG/2ML IJ SOLN
INTRAMUSCULAR | Status: DC | PRN
Start: 2015-10-31 — End: 2015-10-31
  Administered 2015-10-31: 2 mg via INTRAVENOUS

## 2015-10-31 SURGICAL SUPPLY — 15 items
CEMENT BONE KYPHON CDS (Cement) ×3 IMPLANT
DEVICE BIOPSY BONE KYPHX (INSTRUMENTS) IMPLANT
DRAPE C-ARM XRAY 36X54 (DRAPES) ×3 IMPLANT
DURAPREP 26ML APPLICATOR (WOUND CARE) IMPLANT
GLOVE SURG ORTHO 9.0 STRL STRW (GLOVE) ×3 IMPLANT
GOWN SPECIALTY ULTRA XL (MISCELLANEOUS) ×3 IMPLANT
GOWN STRL REUS W/ TWL LRG LVL3 (GOWN DISPOSABLE) ×1 IMPLANT
GOWN STRL REUS W/TWL LRG LVL3 (GOWN DISPOSABLE) ×2
LIQUID BAND (GAUZE/BANDAGES/DRESSINGS) ×3 IMPLANT
PACK KYPHOPLASTY (MISCELLANEOUS) ×3 IMPLANT
SCRUB PCMX 4 OZ (MISCELLANEOUS) ×3 IMPLANT
STRAP SAFETY BODY (MISCELLANEOUS) ×3 IMPLANT
TRAY KYPHOPAK 15/2 EXPRESS (KITS) ×3 IMPLANT
TRAY KYPHOPAK 15/3 EXPRESS 1ST (MISCELLANEOUS) IMPLANT
TRAY KYPHOPAK 20/3 EXPRESS 1ST (MISCELLANEOUS) IMPLANT

## 2015-10-31 NOTE — H&P (Signed)
Subjective:   Patient is a 65 y.o. female presents with T6 compression fracture. Onset of symptoms was abrupt starting 3 weeks ago with gradually worsening course since that time. The pain is located in the upper back and runs down all her back. Patient describes the pain as sharp continuous and rated as severe. Pain has been associated with a coughing episode 3 weeks ago. Patient denies any fall. Symptoms are aggravated by any movement. Symptoms improve with nothing patient has tried. Past history includes prior kyphoplasty lumbar spine.  Previous studies include MRI showing acute T6 compression fracture with some anterior body compression.  There are no active problems to display for this patient.  Past Medical History  Diagnosis Date  . Osteoporosis   . Fibromyalgia   . Chronic pain   . COPD (chronic obstructive pulmonary disease) (HCC)   . Anemia     Past Surgical History  Procedure Laterality Date  . Left shoulder surgery    . Kyphoplasty    . Replacement total knee Left   . Knee surgery Right   . Arm surgery Left   . Wrist surgery Bilateral   . Cholecystectomy    . Eye surgery    . Cataract extraction Bilateral     Prescriptions prior to admission  Medication Sig Dispense Refill Last Dose  . acetaminophen (TYLENOL) 500 MG tablet Take 500 mg by mouth every 8 (eight) hours as needed. 3-4 tabs every 8 hours   10/30/2015 at Unknown time  . ALPRAZolam (XANAX) 0.5 MG tablet Take 0.5 mg by mouth 3 (three) times daily.   10/31/2015 at 0830  . Fluticasone-Salmeterol (ADVAIR) 500-50 MCG/DOSE AEPB Inhale 1 puff into the lungs 2 (two) times daily.   10/31/2015 at 1130  . gabapentin (NEURONTIN) 800 MG tablet Take 800 mg by mouth 4 (four) times daily.   10/31/2015 at 0830  . methocarbamol (ROBAXIN) 750 MG tablet Take 750 mg by mouth 3 (three) times daily.   10/31/2015 at 0830  . oxyCODONE (OXY IR/ROXICODONE) 5 MG immediate release tablet Take 5 mg by mouth every 6 (six) hours as needed for severe  pain.   10/31/2015 at Unknown time  . sertraline (ZOLOFT) 100 MG tablet Take 100 mg by mouth daily.   10/31/2015 at 0830  . tiotropium (SPIRIVA) 18 MCG inhalation capsule Place 18 mcg into inhaler and inhale daily.   10/31/2015 at 1130  . zolpidem (AMBIEN) 5 MG tablet Take 5 mg by mouth at bedtime as needed for sleep.   10/30/2015 at Unknown time  . calcium-vitamin D (OSCAL WITH D) 500-200 MG-UNIT per tablet Take 1 tablet by mouth daily.   10/26/2015  . Garlic 100 MG TABS Take by mouth daily.   10/26/2015  . meloxicam (MOBIC) 15 MG tablet Take 15 mg by mouth 2 (two) times daily.   10/26/2015  . Omega-3 Fatty Acids (FISH OIL) 1000 MG CAPS Take by mouth daily.   10/26/2015 at 0800  . sennosides-docusate sodium (SENOKOT-S) 8.6-50 MG tablet Take 2 tablets by mouth 2 (two) times daily. Reported on 10/31/2015   Not Taking at Unknown time  . traMADol (ULTRAM) 50 MG tablet Take 1 tablet (50 mg total) by mouth every 6 (six) hours as needed. (Patient not taking: Reported on 10/31/2015) 30 tablet 0 Completed Course at Unknown time  . traMADol (ULTRAM) 50 MG tablet Take 1 tablet (50 mg total) by mouth 3 (three) times daily after meals. (Patient not taking: Reported on 10/31/2015) 30 tablet 0 Completed Course  at Unknown time  . vitamin E 400 UNIT capsule Take 400 Units by mouth daily.   10/26/2015   Allergies  Allergen Reactions  . Ivp Dye [Iodinated Diagnostic Agents] Anaphylaxis  . Iodides Hives  . Iodine   . Penicillins Hives    Social History  Substance Use Topics  . Smoking status: Current Every Day Smoker -- 0.50 packs/day for 40 years    Types: Cigarettes  . Smokeless tobacco: Not on file  . Alcohol Use: No    Family History  Problem Relation Age of Onset  . Alcohol abuse Mother   . Arthritis Mother   . Asthma Mother   . Cancer Mother   . COPD Mother   . Depression Mother   . Arthritis Father   . Depression Father   . Heart disease Father   . Stroke Father   . Alcohol abuse Sister   . Drug  abuse Sister     Review of Systems Pertinent items are noted in HPI.  Objective:   Patient Vitals for the past 8 hrs:  BP Temp Temp src Pulse Resp SpO2 Height Weight  10/31/15 1346 136/68 mmHg 97.9 F (36.6 C) Oral 74 18 95 % 5\' 4"  (1.626 m) 68.493 kg (151 lb)          BP 136/68 mmHg  Pulse 74  Temp(Src) 97.9 F (36.6 C) (Oral)  Resp 18  Ht 5\' 4"  (1.626 m)  Wt 68.493 kg (151 lb)  BMI 25.91 kg/m2  SpO2 95% General appearance: alert, cooperative, appears older than stated age and moderate distress Back: Severe tenderness at T6 in the midline with palpation of the spinous process Lungs: wheezes bilaterally Heart: regular rate and rhythm, S1, S2 normal, no murmur, click, rub or gallop   Data Review  MRI shows compression fracture at T6  Assessment:   Active Problems:   * No active hospital problems. * T6 subacute compression fracture with severe pain  Plan:   T6 kyphoplasty.

## 2015-10-31 NOTE — Op Note (Signed)
10/31/2015  3:35 PM  PATIENT:  Cheyenne Grimes  65 y.o. female  PRE-OPERATIVE DIAGNOSIS:  closed wedge compression fracture,T6  POST-OPERATIVE DIAGNOSIS:  closed wedge compression fracture,  PROCEDURE:  Procedure(s):  KYPHOPLASTY T6 (N/A)  SURGEON: Leitha SchullerMichael J Milford Cilento, MD  ASSISTANTS: none   ANESTHESIA:   local and general  EBL:  Total I/O In: 400 [I.V.:400] Out: -   BLOOD ADMINISTERED:none  DRAINS: none   LOCAL MEDICATIONS USED:  MARCAINE    and XYLOCAINE   SPECIMEN:  Source of Specimen:  T6 vertebral body  DISPOSITION OF SPECIMEN:  PATHOLOGY  COUNTS:  YES  TOURNIQUET:  * No tourniquets in log *  IMPLANTS: bone cement  DICTATION: .Dragon Dictation Patient place in prone position and T6 visualized in AP and lateral projections with C-arms.   Time out and patient identification procedure performed.A total of 10 cc of 1% Xylocaine was infiltrated subcutaneously in the areas of planned incisions. The back was then prepped and draped in sterile fashion and repeat timeout procedure carried out. T6  was addressed with spinal needle being used to get local anesthetic down to the pedicle on the right and left side with a 50-50 mix of 1% Xylocaine have percent Sensorcaine 20 cc of each side  Next a small incision was made on the right side and a extrapedicular approach was made with trocar getting into the vertebral body. Biopsy was performed. Balloon was inserted and was in the midline and so only simple sided stick was performed with inflation of the balloon to about 2 cc was partial correction of deformity.  Cement was mixed and T6 filled with just over 2 cc of bone cement inserted without extravasation.. After this was completed and the cement set the trochar was removed and permanent C-arm views were obtained. The wounds were dressed with Dermabond followed by Band-Aids and patient sent to recovery in stable condition     PLAN OF CARE: Discharge to home after PACU  PATIENT  DISPOSITION:  PACU - hemodynamically stable.

## 2015-10-31 NOTE — Anesthesia Preprocedure Evaluation (Signed)
Anesthesia Evaluation  Patient identified by MRN, date of birth, ID band Patient awake    Reviewed: Allergy & Precautions, NPO status , Patient's Chart, lab work & pertinent test results  Airway Mallampati: III       Dental  (+) Upper Dentures, Edentulous Lower   Pulmonary COPD, Current Smoker,     + decreased breath sounds      Cardiovascular Exercise Tolerance: Good  Rhythm:Regular Rate:Normal     Neuro/Psych    GI/Hepatic negative GI ROS, Neg liver ROS,   Endo/Other  negative endocrine ROS  Renal/GU      Musculoskeletal   Abdominal Normal abdominal exam  (+)   Peds  Hematology  (+) anemia ,   Anesthesia Other Findings   Reproductive/Obstetrics                             Anesthesia Physical Anesthesia Plan  ASA: III  Anesthesia Plan: MAC   Post-op Pain Management:    Induction: Intravenous  Airway Management Planned: Natural Airway and Nasal Cannula  Additional Equipment:   Intra-op Plan:   Post-operative Plan:   Informed Consent: I have reviewed the patients History and Physical, chart, labs and discussed the procedure including the risks, benefits and alternatives for the proposed anesthesia with the patient or authorized representative who has indicated his/her understanding and acceptance.     Plan Discussed with: CRNA  Anesthesia Plan Comments:         Anesthesia Quick Evaluation

## 2015-10-31 NOTE — Transfer of Care (Signed)
Immediate Anesthesia Transfer of Care Note  Patient: Cheyenne Grimes  Procedure(s) Performed: Procedure(s):  KYPHOPLASTY T6 (N/A)  Patient Location: PACU  Anesthesia Type:General  Level of Consciousness: awake  Airway & Oxygen Therapy: Patient Spontanous Breathing and Patient connected to nasal cannula oxygen  Post-op Assessment: Report given to RN and Post -op Vital signs reviewed and stable  Post vital signs: Reviewed and stable  Last Vitals:  Filed Vitals:   10/31/15 1346 10/31/15 1535  BP: 136/68 130/73  Pulse: 74 85  Temp: 36.6 C 36.1 C  Resp: 18 20    Last Pain:  Filed Vitals:   10/31/15 1537  PainSc: 9          Complications: No apparent anesthesia complications

## 2015-10-31 NOTE — Discharge Instructions (Addendum)
May remove band aid on Thursday.  Ok to shower starting Thursday. Minimize activity today and tomorrow, activities as tolerated Thursday. Pain medicine as directed. Call office if pain gets more severe.AMBULATORY SURGERY  DISCHARGE INSTRUCTIONS   1) The drugs that you were given will stay in your system until tomorrow so for the next 24 hours you should not:  A) Drive an automobile B) Make any legal decisions C) Drink any alcoholic beverage   2) You may resume regular meals tomorrow.  Today it is better to start with liquids and gradually work up to solid foods.  You may eat anything you prefer, but it is better to start with liquids, then soup and crackers, and gradually work up to solid foods.   3) Please notify your doctor immediately if you have any unusual bleeding, trouble breathing, redness and pain at the surgery site, drainage, fever, or pain not relieved by medication.    4) Additional Instructions:        Please contact your physician with any problems or Same Day Surgery at [phone], Monday through Friday 6 am to 4 pm, or [place] at [facility] Main number at [phone].AMBULATORY SURGERY  DISCHARGE INSTRUCTIONS   5) The drugs that you were given will stay in your system until tomorrow so for the next 24 hours you should not:  D) Drive an automobile E) Make any legal decisions F) Drink any alcoholic beverage   6) You may resume regular meals tomorrow.  Today it is better to start with liquids and gradually work up to solid foods.  You may eat anything you prefer, but it is better to start with liquids, then soup and crackers, and gradually work up to solid foods.   7) Please notify your doctor immediately if you have any unusual bleeding, trouble breathing, redness and pain at the surgery site, drainage, fever, or pain not relieved by medication.    8) Additional Instructions:        Please contact your physician with any problems or Same  Day Surgery at [phone], Monday through Friday 6 am to 4 pm, or [place] at [facility] Main number at [phone].

## 2015-10-31 NOTE — Progress Notes (Signed)
Pain level 2 after pain med   States better

## 2015-11-01 ENCOUNTER — Encounter: Payer: Self-pay | Admitting: Orthopedic Surgery

## 2015-11-02 LAB — SURGICAL PATHOLOGY

## 2015-11-02 NOTE — Anesthesia Postprocedure Evaluation (Signed)
Anesthesia Post Note  Patient: Cheyenne Grimes  Procedure(s) Performed: Procedure(s) (LRB):  KYPHOPLASTY T6 (N/A)  Patient location during evaluation: PACU Anesthesia Type: MAC Level of consciousness: awake Pain management: pain level controlled Vital Signs Assessment: post-procedure vital signs reviewed and stable Respiratory status: spontaneous breathing Cardiovascular status: blood pressure returned to baseline Anesthetic complications: no    Last Vitals:  Filed Vitals:   10/31/15 1635 10/31/15 1700  BP: 155/76 157/70  Pulse: 79 75  Temp: 35.9 C   Resp: 16     Last Pain:  Filed Vitals:   10/31/15 1738  PainSc: 2                  VAN STAVEREN,June Rode

## 2015-12-07 ENCOUNTER — Other Ambulatory Visit: Payer: Self-pay | Admitting: Internal Medicine

## 2015-12-07 DIAGNOSIS — Z1231 Encounter for screening mammogram for malignant neoplasm of breast: Secondary | ICD-10-CM

## 2015-12-14 ENCOUNTER — Other Ambulatory Visit: Payer: Self-pay | Admitting: Orthopedic Surgery

## 2015-12-14 DIAGNOSIS — M549 Dorsalgia, unspecified: Secondary | ICD-10-CM

## 2015-12-14 DIAGNOSIS — Z9889 Other specified postprocedural states: Secondary | ICD-10-CM

## 2015-12-27 ENCOUNTER — Ambulatory Visit: Payer: Medicare PPO

## 2016-01-02 ENCOUNTER — Ambulatory Visit
Admission: RE | Admit: 2016-01-02 | Discharge: 2016-01-02 | Disposition: A | Discharge status: Home / Self Care | Payer: Medicare PPO | Source: Ambulatory Visit | Attending: Orthopedic Surgery | Admitting: Orthopedic Surgery

## 2016-01-02 DIAGNOSIS — M549 Dorsalgia, unspecified: Secondary | ICD-10-CM | POA: Diagnosis not present

## 2016-01-02 DIAGNOSIS — Z9889 Other specified postprocedural states: Secondary | ICD-10-CM | POA: Insufficient documentation

## 2016-01-09 ENCOUNTER — Other Ambulatory Visit: Payer: Self-pay | Admitting: Internal Medicine

## 2016-01-09 ENCOUNTER — Ambulatory Visit
Admission: RE | Admit: 2016-01-09 | Discharge: 2016-01-09 | Disposition: A | Discharge status: Home / Self Care | Payer: Medicare PPO | Source: Ambulatory Visit | Attending: Internal Medicine | Admitting: Internal Medicine

## 2016-01-09 DIAGNOSIS — Z1231 Encounter for screening mammogram for malignant neoplasm of breast: Secondary | ICD-10-CM

## 2016-01-11 ENCOUNTER — Other Ambulatory Visit: Payer: Self-pay | Admitting: Orthopedic Surgery

## 2016-01-11 DIAGNOSIS — M9941 Connective tissue stenosis of neural canal of cervical region: Secondary | ICD-10-CM

## 2016-01-24 ENCOUNTER — Ambulatory Visit
Admission: RE | Admit: 2016-01-24 | Discharge: 2016-01-24 | Disposition: A | Discharge status: Home / Self Care | Payer: Medicare PPO | Source: Ambulatory Visit | Attending: Orthopedic Surgery | Admitting: Orthopedic Surgery

## 2016-01-24 DIAGNOSIS — M9941 Connective tissue stenosis of neural canal of cervical region: Secondary | ICD-10-CM | POA: Diagnosis not present

## 2016-01-24 DIAGNOSIS — M50221 Other cervical disc displacement at C4-C5 level: Secondary | ICD-10-CM | POA: Diagnosis not present

## 2016-01-24 DIAGNOSIS — M50222 Other cervical disc displacement at C5-C6 level: Secondary | ICD-10-CM | POA: Diagnosis not present

## 2016-05-31 ENCOUNTER — Other Ambulatory Visit: Payer: Self-pay | Admitting: Otolaryngology

## 2016-05-31 DIAGNOSIS — H9312 Tinnitus, left ear: Secondary | ICD-10-CM

## 2016-06-12 ENCOUNTER — Ambulatory Visit: Payer: Medicare PPO

## 2016-08-08 DIAGNOSIS — W19XXXA Unspecified fall, initial encounter: Secondary | ICD-10-CM | POA: Diagnosis not present

## 2016-08-08 DIAGNOSIS — M25561 Pain in right knee: Secondary | ICD-10-CM | POA: Diagnosis not present

## 2016-08-08 DIAGNOSIS — Z23 Encounter for immunization: Secondary | ICD-10-CM | POA: Diagnosis not present

## 2016-08-08 DIAGNOSIS — F329 Major depressive disorder, single episode, unspecified: Secondary | ICD-10-CM | POA: Diagnosis not present

## 2016-08-08 DIAGNOSIS — F17200 Nicotine dependence, unspecified, uncomplicated: Secondary | ICD-10-CM | POA: Diagnosis not present

## 2016-08-08 DIAGNOSIS — F419 Anxiety disorder, unspecified: Secondary | ICD-10-CM | POA: Diagnosis not present

## 2016-08-08 DIAGNOSIS — J439 Emphysema, unspecified: Secondary | ICD-10-CM | POA: Diagnosis not present

## 2016-08-08 DIAGNOSIS — M810 Age-related osteoporosis without current pathological fracture: Secondary | ICD-10-CM | POA: Diagnosis not present

## 2016-10-02 DIAGNOSIS — M810 Age-related osteoporosis without current pathological fracture: Secondary | ICD-10-CM | POA: Diagnosis not present

## 2016-10-28 DIAGNOSIS — F17200 Nicotine dependence, unspecified, uncomplicated: Secondary | ICD-10-CM | POA: Diagnosis not present

## 2016-10-28 DIAGNOSIS — T458X5A Adverse effect of other primarily systemic and hematological agents, initial encounter: Secondary | ICD-10-CM | POA: Diagnosis not present

## 2016-10-28 DIAGNOSIS — M87180 Osteonecrosis due to drugs, jaw: Secondary | ICD-10-CM | POA: Diagnosis not present

## 2016-10-28 DIAGNOSIS — Z8781 Personal history of (healed) traumatic fracture: Secondary | ICD-10-CM | POA: Diagnosis not present

## 2016-10-28 DIAGNOSIS — M810 Age-related osteoporosis without current pathological fracture: Secondary | ICD-10-CM | POA: Diagnosis not present

## 2016-12-05 DIAGNOSIS — M79602 Pain in left arm: Secondary | ICD-10-CM | POA: Diagnosis not present

## 2016-12-05 DIAGNOSIS — F5105 Insomnia due to other mental disorder: Secondary | ICD-10-CM | POA: Diagnosis not present

## 2016-12-05 DIAGNOSIS — Z720 Tobacco use: Secondary | ICD-10-CM | POA: Diagnosis not present

## 2016-12-05 DIAGNOSIS — M797 Fibromyalgia: Secondary | ICD-10-CM | POA: Diagnosis not present

## 2016-12-05 DIAGNOSIS — F419 Anxiety disorder, unspecified: Secondary | ICD-10-CM | POA: Diagnosis not present

## 2016-12-05 DIAGNOSIS — J439 Emphysema, unspecified: Secondary | ICD-10-CM | POA: Diagnosis not present

## 2016-12-05 DIAGNOSIS — F329 Major depressive disorder, single episode, unspecified: Secondary | ICD-10-CM | POA: Diagnosis not present

## 2017-02-12 ENCOUNTER — Other Ambulatory Visit: Payer: Self-pay | Admitting: Internal Medicine

## 2017-02-12 DIAGNOSIS — Z1231 Encounter for screening mammogram for malignant neoplasm of breast: Secondary | ICD-10-CM

## 2017-02-24 ENCOUNTER — Ambulatory Visit
Admission: RE | Admit: 2017-02-24 | Discharge: 2017-02-24 | Disposition: A | Discharge status: Home / Self Care | Payer: PPO | Source: Ambulatory Visit | Attending: Internal Medicine | Admitting: Internal Medicine

## 2017-02-24 DIAGNOSIS — Z1231 Encounter for screening mammogram for malignant neoplasm of breast: Secondary | ICD-10-CM | POA: Insufficient documentation

## 2017-04-10 DIAGNOSIS — F419 Anxiety disorder, unspecified: Secondary | ICD-10-CM | POA: Diagnosis not present

## 2017-04-10 DIAGNOSIS — Z0000 Encounter for general adult medical examination without abnormal findings: Secondary | ICD-10-CM | POA: Diagnosis not present

## 2017-04-10 DIAGNOSIS — J439 Emphysema, unspecified: Secondary | ICD-10-CM | POA: Diagnosis not present

## 2017-04-10 DIAGNOSIS — M8580 Other specified disorders of bone density and structure, unspecified site: Secondary | ICD-10-CM | POA: Diagnosis not present

## 2017-04-10 DIAGNOSIS — M797 Fibromyalgia: Secondary | ICD-10-CM | POA: Diagnosis not present

## 2017-04-10 DIAGNOSIS — K219 Gastro-esophageal reflux disease without esophagitis: Secondary | ICD-10-CM | POA: Diagnosis not present

## 2017-04-10 DIAGNOSIS — Z720 Tobacco use: Secondary | ICD-10-CM | POA: Diagnosis not present

## 2017-04-10 DIAGNOSIS — Z23 Encounter for immunization: Secondary | ICD-10-CM | POA: Diagnosis not present

## 2017-08-08 DIAGNOSIS — J439 Emphysema, unspecified: Secondary | ICD-10-CM | POA: Diagnosis not present

## 2017-08-08 DIAGNOSIS — M8580 Other specified disorders of bone density and structure, unspecified site: Secondary | ICD-10-CM | POA: Diagnosis not present

## 2017-08-08 DIAGNOSIS — Z23 Encounter for immunization: Secondary | ICD-10-CM | POA: Diagnosis not present

## 2017-08-08 DIAGNOSIS — F419 Anxiety disorder, unspecified: Secondary | ICD-10-CM | POA: Diagnosis not present

## 2017-08-08 DIAGNOSIS — M797 Fibromyalgia: Secondary | ICD-10-CM | POA: Diagnosis not present

## 2017-08-08 DIAGNOSIS — K219 Gastro-esophageal reflux disease without esophagitis: Secondary | ICD-10-CM | POA: Diagnosis not present

## 2017-08-08 DIAGNOSIS — Z0000 Encounter for general adult medical examination without abnormal findings: Secondary | ICD-10-CM | POA: Diagnosis not present

## 2017-08-08 DIAGNOSIS — Z720 Tobacco use: Secondary | ICD-10-CM | POA: Diagnosis not present

## 2017-08-11 DIAGNOSIS — F329 Major depressive disorder, single episode, unspecified: Secondary | ICD-10-CM | POA: Diagnosis not present

## 2017-08-11 DIAGNOSIS — F419 Anxiety disorder, unspecified: Secondary | ICD-10-CM | POA: Diagnosis not present

## 2017-08-11 DIAGNOSIS — M797 Fibromyalgia: Secondary | ICD-10-CM | POA: Diagnosis not present

## 2017-08-11 DIAGNOSIS — H6121 Impacted cerumen, right ear: Secondary | ICD-10-CM | POA: Diagnosis not present

## 2017-08-11 DIAGNOSIS — E7800 Pure hypercholesterolemia, unspecified: Secondary | ICD-10-CM | POA: Diagnosis not present

## 2017-08-11 DIAGNOSIS — Z0000 Encounter for general adult medical examination without abnormal findings: Secondary | ICD-10-CM | POA: Diagnosis not present

## 2017-08-11 DIAGNOSIS — I10 Essential (primary) hypertension: Secondary | ICD-10-CM | POA: Diagnosis not present

## 2017-08-11 DIAGNOSIS — Z720 Tobacco use: Secondary | ICD-10-CM | POA: Diagnosis not present

## 2017-08-11 DIAGNOSIS — G8929 Other chronic pain: Secondary | ICD-10-CM | POA: Diagnosis not present

## 2017-08-11 DIAGNOSIS — J439 Emphysema, unspecified: Secondary | ICD-10-CM | POA: Diagnosis not present

## 2017-09-03 ENCOUNTER — Other Ambulatory Visit: Payer: Self-pay | Admitting: Otolaryngology

## 2017-09-03 DIAGNOSIS — H903 Sensorineural hearing loss, bilateral: Secondary | ICD-10-CM | POA: Diagnosis not present

## 2017-09-03 DIAGNOSIS — H9209 Otalgia, unspecified ear: Secondary | ICD-10-CM | POA: Diagnosis not present

## 2017-09-03 DIAGNOSIS — IMO0001 Reserved for inherently not codable concepts without codable children: Secondary | ICD-10-CM

## 2017-09-03 DIAGNOSIS — H6121 Impacted cerumen, right ear: Secondary | ICD-10-CM | POA: Diagnosis not present

## 2017-09-03 DIAGNOSIS — H918X2 Other specified hearing loss, left ear: Secondary | ICD-10-CM

## 2017-09-04 ENCOUNTER — Other Ambulatory Visit: Payer: Self-pay | Admitting: Otolaryngology

## 2017-09-04 DIAGNOSIS — H918X1 Other specified hearing loss, right ear: Secondary | ICD-10-CM

## 2017-09-04 DIAGNOSIS — IMO0001 Reserved for inherently not codable concepts without codable children: Secondary | ICD-10-CM

## 2017-09-11 ENCOUNTER — Ambulatory Visit
Admission: RE | Admit: 2017-09-11 | Discharge: 2017-09-11 | Disposition: A | Discharge status: Home / Self Care | Payer: PPO | Source: Ambulatory Visit | Attending: Otolaryngology | Admitting: Otolaryngology

## 2017-09-11 ENCOUNTER — Ambulatory Visit: Payer: PPO

## 2017-09-11 DIAGNOSIS — I999 Unspecified disorder of circulatory system: Secondary | ICD-10-CM | POA: Diagnosis not present

## 2017-09-11 DIAGNOSIS — H918X1 Other specified hearing loss, right ear: Secondary | ICD-10-CM

## 2017-09-11 DIAGNOSIS — H903 Sensorineural hearing loss, bilateral: Secondary | ICD-10-CM | POA: Diagnosis not present

## 2017-09-11 DIAGNOSIS — IMO0001 Reserved for inherently not codable concepts without codable children: Secondary | ICD-10-CM

## 2017-09-11 DIAGNOSIS — H9193 Unspecified hearing loss, bilateral: Secondary | ICD-10-CM | POA: Diagnosis not present

## 2017-09-11 MED ORDER — GADOBENATE DIMEGLUMINE 529 MG/ML IV SOLN
15.0000 mL | Freq: Once | INTRAVENOUS | Status: AC | PRN
  Administered 2017-09-11: 14 mL via INTRAVENOUS

## 2018-02-09 DIAGNOSIS — H9209 Otalgia, unspecified ear: Secondary | ICD-10-CM | POA: Diagnosis not present

## 2018-02-09 DIAGNOSIS — J209 Acute bronchitis, unspecified: Secondary | ICD-10-CM | POA: Diagnosis not present

## 2018-02-09 DIAGNOSIS — M810 Age-related osteoporosis without current pathological fracture: Secondary | ICD-10-CM | POA: Diagnosis not present

## 2018-02-09 DIAGNOSIS — M797 Fibromyalgia: Secondary | ICD-10-CM | POA: Diagnosis not present

## 2018-02-09 DIAGNOSIS — Z720 Tobacco use: Secondary | ICD-10-CM | POA: Diagnosis not present

## 2018-02-09 DIAGNOSIS — F329 Major depressive disorder, single episode, unspecified: Secondary | ICD-10-CM | POA: Diagnosis not present

## 2018-02-09 DIAGNOSIS — J439 Emphysema, unspecified: Secondary | ICD-10-CM | POA: Diagnosis not present

## 2018-02-09 DIAGNOSIS — G4700 Insomnia, unspecified: Secondary | ICD-10-CM | POA: Diagnosis not present

## 2018-06-08 DIAGNOSIS — F329 Major depressive disorder, single episode, unspecified: Secondary | ICD-10-CM | POA: Diagnosis not present

## 2018-06-08 DIAGNOSIS — J209 Acute bronchitis, unspecified: Secondary | ICD-10-CM | POA: Diagnosis not present

## 2018-06-08 DIAGNOSIS — M810 Age-related osteoporosis without current pathological fracture: Secondary | ICD-10-CM | POA: Diagnosis not present

## 2018-06-08 DIAGNOSIS — Z720 Tobacco use: Secondary | ICD-10-CM | POA: Diagnosis not present

## 2018-06-08 DIAGNOSIS — M797 Fibromyalgia: Secondary | ICD-10-CM | POA: Diagnosis not present

## 2018-06-08 DIAGNOSIS — H9209 Otalgia, unspecified ear: Secondary | ICD-10-CM | POA: Diagnosis not present

## 2018-06-08 DIAGNOSIS — J439 Emphysema, unspecified: Secondary | ICD-10-CM | POA: Diagnosis not present

## 2018-06-16 DIAGNOSIS — E039 Hypothyroidism, unspecified: Secondary | ICD-10-CM | POA: Diagnosis not present

## 2018-06-16 DIAGNOSIS — F333 Major depressive disorder, recurrent, severe with psychotic symptoms: Secondary | ICD-10-CM | POA: Diagnosis not present

## 2018-06-16 DIAGNOSIS — K219 Gastro-esophageal reflux disease without esophagitis: Secondary | ICD-10-CM | POA: Diagnosis not present

## 2018-06-16 DIAGNOSIS — F17200 Nicotine dependence, unspecified, uncomplicated: Secondary | ICD-10-CM | POA: Diagnosis not present

## 2018-06-16 DIAGNOSIS — M797 Fibromyalgia: Secondary | ICD-10-CM | POA: Diagnosis not present

## 2018-06-16 DIAGNOSIS — G8929 Other chronic pain: Secondary | ICD-10-CM | POA: Diagnosis not present

## 2018-06-16 DIAGNOSIS — M545 Low back pain: Secondary | ICD-10-CM | POA: Diagnosis not present

## 2018-06-16 DIAGNOSIS — M150 Primary generalized (osteo)arthritis: Secondary | ICD-10-CM | POA: Diagnosis not present

## 2018-07-20 ENCOUNTER — Other Ambulatory Visit
Admission: RE | Admit: 2018-07-20 | Discharge: 2018-07-20 | Disposition: A | Discharge status: Home / Self Care | Payer: PPO | Source: Ambulatory Visit | Attending: Nurse Practitioner | Admitting: Nurse Practitioner

## 2018-07-20 DIAGNOSIS — R197 Diarrhea, unspecified: Secondary | ICD-10-CM | POA: Diagnosis not present

## 2018-07-20 DIAGNOSIS — Z7689 Persons encountering health services in other specified circumstances: Secondary | ICD-10-CM | POA: Diagnosis not present

## 2018-07-20 DIAGNOSIS — Z86010 Personal history of colonic polyps: Secondary | ICD-10-CM | POA: Diagnosis not present

## 2018-07-20 DIAGNOSIS — D7589 Other specified diseases of blood and blood-forming organs: Secondary | ICD-10-CM | POA: Diagnosis not present

## 2018-07-20 DIAGNOSIS — Z79899 Other long term (current) drug therapy: Secondary | ICD-10-CM | POA: Diagnosis not present

## 2018-07-20 DIAGNOSIS — J439 Emphysema, unspecified: Secondary | ICD-10-CM | POA: Diagnosis not present

## 2018-07-20 DIAGNOSIS — I10 Essential (primary) hypertension: Secondary | ICD-10-CM | POA: Diagnosis not present

## 2018-07-20 DIAGNOSIS — R748 Abnormal levels of other serum enzymes: Secondary | ICD-10-CM | POA: Diagnosis not present

## 2018-07-20 LAB — GASTROINTESTINAL PANEL BY PCR, STOOL (REPLACES STOOL CULTURE)

## 2018-07-20 LAB — C DIFFICILE QUICK SCREEN W PCR REFLEX
C Diff antigen: NEGATIVE
C Diff interpretation: NOT DETECTED
C Diff toxin: NEGATIVE

## 2018-07-21 DIAGNOSIS — G43719 Chronic migraine without aura, intractable, without status migrainosus: Secondary | ICD-10-CM | POA: Diagnosis not present

## 2018-07-22 LAB — CALPROTECTIN, FECAL: Calprotectin, Fecal: <16 ug/g (ref 0–120)

## 2018-07-23 LAB — PANCREATIC ELASTASE, FECAL

## 2018-07-27 DIAGNOSIS — F331 Major depressive disorder, recurrent, moderate: Secondary | ICD-10-CM | POA: Diagnosis not present

## 2018-07-27 DIAGNOSIS — M5481 Occipital neuralgia: Secondary | ICD-10-CM | POA: Diagnosis not present

## 2018-07-27 DIAGNOSIS — M79602 Pain in left arm: Secondary | ICD-10-CM | POA: Diagnosis not present

## 2018-07-27 DIAGNOSIS — J439 Emphysema, unspecified: Secondary | ICD-10-CM | POA: Diagnosis not present

## 2018-07-27 DIAGNOSIS — F419 Anxiety disorder, unspecified: Secondary | ICD-10-CM | POA: Diagnosis not present

## 2018-07-27 DIAGNOSIS — Z720 Tobacco use: Secondary | ICD-10-CM | POA: Diagnosis not present

## 2018-07-27 DIAGNOSIS — E538 Deficiency of other specified B group vitamins: Secondary | ICD-10-CM | POA: Diagnosis not present

## 2018-07-28 DIAGNOSIS — G43719 Chronic migraine without aura, intractable, without status migrainosus: Secondary | ICD-10-CM | POA: Insufficient documentation

## 2018-07-30 ENCOUNTER — Other Ambulatory Visit: Payer: Self-pay

## 2018-07-30 ENCOUNTER — Ambulatory Visit
Payer: PPO | Attending: Student in an Organized Health Care Education/Training Program | Admitting: Student in an Organized Health Care Education/Training Program

## 2018-07-30 ENCOUNTER — Encounter: Payer: Self-pay | Admitting: Student in an Organized Health Care Education/Training Program

## 2018-07-30 VITALS — BP 168/79 | HR 86 | Temp 98.5°F | Resp 18 | Wt 169.0 lb

## 2018-07-30 DIAGNOSIS — M47816 Spondylosis without myelopathy or radiculopathy, lumbar region: Secondary | ICD-10-CM

## 2018-07-30 DIAGNOSIS — M5481 Occipital neuralgia: Secondary | ICD-10-CM | POA: Diagnosis not present

## 2018-07-30 DIAGNOSIS — M792 Neuralgia and neuritis, unspecified: Secondary | ICD-10-CM

## 2018-07-30 DIAGNOSIS — M51369 Other intervertebral disc degeneration, lumbar region without mention of lumbar back pain or lower extremity pain: Secondary | ICD-10-CM

## 2018-07-30 DIAGNOSIS — S42402G Unspecified fracture of lower end of left humerus, subsequent encounter for fracture with delayed healing: Secondary | ICD-10-CM | POA: Diagnosis not present

## 2018-07-30 DIAGNOSIS — G5622 Lesion of ulnar nerve, left upper limb: Secondary | ICD-10-CM | POA: Diagnosis not present

## 2018-07-30 DIAGNOSIS — M797 Fibromyalgia: Secondary | ICD-10-CM | POA: Insufficient documentation

## 2018-07-30 DIAGNOSIS — F331 Major depressive disorder, recurrent, moderate: Secondary | ICD-10-CM

## 2018-07-30 DIAGNOSIS — M5136 Other intervertebral disc degeneration, lumbar region: Secondary | ICD-10-CM | POA: Insufficient documentation

## 2018-07-30 NOTE — Patient Instructions (Addendum)
In order to be considered for chronic opioid therapy, you will need to find an alternative to Xanax.  Please discuss with Dr. Marcello Fennel.  If you are able to successfully discontinue your Xanax we will need to obtain a urine drug screen that will need to be negative for Xanax before we can consider chronic opioid therapy.  For your low back pain we can try lumbar facet medial branch nerve blocks.  If effective can consider lumbar radiofrequency ablation.  ____________________________________________________________________________________________  Preparing for Procedure with Sedation  Instructions: . Oral Intake: Do not eat or drink anything for at least 8 hours prior to your procedure. . Transportation: Public transportation is not allowed. Bring an adult driver. The driver must be physically present in our waiting room before any procedure can be started. [place] Kitchen Physical Assistance: Bring an adult physically capable of assisting you, in the event you need help. This adult should keep you company at home for at least 6 hours after the procedure. . Blood Pressure Medicine: Take your blood pressure medicine with a sip of water the morning of the procedure. . Blood thinners: Notify our staff if you are taking any blood thinners. Depending on which one you take, there will be specific instructions on how and when to stop it. . Diabetics on insulin: Notify the staff so that you can be scheduled 1st case in the morning. If your diabetes requires high dose insulin, take only  of your normal insulin dose the morning of the procedure and notify the staff that you have done so. . Preventing infections: Shower with an antibacterial soap the morning of your procedure. . Build-up your immune system: Take 1000 mg of Vitamin C with every meal (3 times a day) the day prior to your procedure. Marland Kitchen Antibiotics: Inform the staff if you have a condition or reason that requires you to take antibiotics before dental  procedures. . Pregnancy: If you are pregnant, call and cancel the procedure. . Sickness: If you have a cold, fever, or any active infections, call and cancel the procedure. . Arrival: You must be in the facility at least 30 minutes prior to your scheduled procedure. . Children: Do not bring children with you. . Dress appropriately: Bring dark clothing that you would not mind if they get stained. . Valuables: Do not bring any jewelry or valuables.  Procedure appointments are reserved for interventional treatments only. Marland Kitchen No Prescription Refills. . No medication changes will be discussed during procedure appointments. . No disability issues will be discussed.  Reasons to call and reschedule or cancel your procedure: (Following these recommendations will minimize the risk of a serious complication.) . Surgeries: Avoid having procedures within 2 weeks of any surgery. (Avoid for 2 weeks before or after any surgery). . Flu Shots: Avoid having procedures within 2 weeks of a flu shots or . (Avoid for 2 weeks before or after immunizations). . Barium: Avoid having a procedure within 7-10 days after having had a radiological study involving the use of radiological contrast. (Myelograms, Barium swallow or enema study). . Heart attacks: Avoid any elective procedures or surgeries for the initial 6 months after a "Myocardial Infarction" (Heart Attack). . Blood thinners: It is imperative that you stop these medications before procedures. Let us know if you if you take any blood thinner.  . Infection: Avoid procedures during or within two weeks of an infection (including chest colds or gastrointestinal problems). Symptoms associated with infections include: Localized redness, fever, chills, night sweats or profuse sweating,  burning sensation when voiding, cough, congestion, stuffiness, runny nose, sore throat, diarrhea, nausea, vomiting, cold or Flu symptoms, recent or current infections. It is specially important  if the infection is over the area that we intend to treat. Marland Kitchen. Heart and lung problems: Symptoms that may suggest an active cardiopulmonary problem include: cough, chest pain, breathing difficulties or shortness of breath, dizziness, ankle swelling, uncontrolled high or unusually low blood pressure, and/or palpitations. If you are experiencing any of these symptoms, cancel your procedure and contact your primary care physician for an evaluation.  Remember:  Regular Business hours are:  Monday to Thursday 8:00 AM to 4:00 PM  Provider's Schedule: Delano MetzFrancisco Naveira, MD:  Procedure days: Tuesday and Thursday 7:30 AM to 4:00 PM  Edward JollyBilal Stephanie Mcglone, MD:  Procedure days: Monday and Wednesday 7:30 AM to 4:00 PM ____________________________________________________________________________________________  Facet Blocks Patient Information  Description: The facets are joints in the spine between the vertebrae.  Like any joints in the body, facets can become irritated and painful.  Arthritis can also effect the facets.  By injecting steroids and local anesthetic in and around these joints, we can temporarily block the nerve supply to them.  Steroids act directly on irritated nerves and tissues to reduce selling and inflammation which often leads to decreased pain.  Facet blocks may be done anywhere along the spine from the neck to the low back depending upon the location of your pain.   After numbing the skin with local anesthetic (like Novocaine), a small needle is passed onto the facet joints under x-ray guidance.  You may experience a sensation of pressure while this is being done.  The entire block usually lasts about 15-25 minutes.   Conditions which may be treated by facet blocks:   Low back/buttock pain  Neck/shoulder pain  Certain types of headaches  Preparation for the injection:  1. Do not eat any solid food or dairy products within 8 hours of your appointment. 2. You may drink clear liquid up  to 3 hours before appointment.  Clear liquids include water, black coffee, juice or soda.  No milk or cream please. 3. You may take your regular medication, including pain medications, with a sip of water before your appointment.  Diabetics should hold regular insulin (if taken separately) and take 1/2 normal NPH dose the morning of the procedure.  Carry some sugar containing items with you to your appointment. 4. A driver must accompany you and be prepared to drive you home after your procedure. 5. Bring all your current medications with you. 6. An IV may be inserted and sedation may be given at the discretion of the physician. 7. A blood pressure cuff, EKG and other monitors will often be applied during the procedure.  Some patients may need to have extra oxygen administered for a short period. 8. You will be asked to provide medical information, including your allergies and medications, prior to the procedure.  We must know immediately if you are taking blood thinners (like Coumadin/Warfarin) or if you are allergic to IV iodine contrast (dye).  We must know if you could possible be pregnant.  Possible side-effects:   Bleeding from needle site  Infection (rare, may require surgery)  Nerve injury (rare)  Numbness & tingling (temporary)  Difficulty urinating (rare, temporary)  Spinal headache (a headache worse with upright posture)  Light-headedness (temporary)  Pain at injection site (serveral days)  Decreased blood pressure (rare, temporary)  Weakness in arm/leg (temporary)  Pressure sensation in back/neck (temporary)  Call if you experience:   Fever/chills associated with headache or increased back/neck pain  Headache worsened by an upright position  New onset, weakness or numbness of an extremity below the injection site  Hives or difficulty breathing (go to the emergency room)  Inflammation or drainage at the injection site(s)  Severe back/neck pain greater than  usual  New symptoms which are concerning to you  Please note:  Although the local anesthetic injected can often make your back or neck feel good for several hours after the injection, the pain will likely return. It takes 3-7 days for steroids to work.  You may not notice any pain relief for at least one week.  If effective, we will often do a series of 2-3 injections spaced 3-6 weeks apart to maximally decrease your pain.  After the initial series, you may be a candidate for a more permanent nerve block of the facets.  If you have any questions, please call #[phone] [facility]

## 2018-07-30 NOTE — Progress Notes (Signed)
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 Mild-to-medarate deconditioning  Sensory (Neurological): Unimpaired          Sensory (Neurological): Neurogenic pain pattern          Palpation: No palpable anomalies              Palpation: Complains of area being tender to palpation              Provocative Test(s):  Phalen's test: deferred Tinel's test: deferred Apley's scratch test (touch opposite shoulder):  Action 1 (Across chest): deferred Action 2 (Overhead): deferred Action 3 (LB reach): deferred   Provocative Test(s):  Phalen's test: deferred Tinel's test: deferred Apley's scratch test (touch opposite shoulder):  Action 1 (Across chest): Decreased ROM Action 2 (Overhead): Decreased ROM Action 3 (LB reach): Decreased ROM    Thoracic Spine Area Exam  Skin & Axial Inspection: Well healed scar from previous spine surgery detected Alignment: Asymmetric Functional ROM: Decreased ROM Stability: No instability  detected Muscle Tone/Strength: Functionally intact. No obvious neuro-muscular anomalies detected. Sensory (Neurological): Musculoskeletal pain pattern Muscle strength & Tone: No palpable anomalies  Lumbar Spine Area Exam  Skin & Axial Inspection: No masses, redness, or swelling Alignment: Symmetrical Functional ROM: Decreased ROM       Stability: No instability detected Muscle Tone/Strength: Functionally intact. No obvious neuro-muscular anomalies detected. Sensory (Neurological): Musculoskeletal pain pattern and dermatomal Palpation: No palpable anomalies       Provocative Tests: Hyperextension/rotation test: (+) bilaterally for facet joint pain. Lumbar quadrant test (Kemp's test): (+) bilaterally for facet joint pain. Lateral bending test: (+) due to pain. Patrick's Maneuver: deferred today                   FABER* test: deferred today                   S-I anterior distraction/compression test: deferred today         S-I lateral compression test: deferred today         S-I Thigh-thrust test: deferred today         S-I Gaenslen's test: deferred today         *(Flexion, ABduction and External Rotation)  Gait & Posture Assessment  Ambulation: Patient ambulates using a cane Gait: Limited. Using assistive device to ambulate Posture: Difficulty standing up straight, due to pain   Lower Extremity Exam    Side: Right lower extremity  Side: Left lower extremity  Stability: No instability observed          Stability: No instability observed          Skin & Extremity Inspection: Skin color, temperature, and hair growth are WNL. No peripheral edema or cyanosis. No masses, redness, swelling, asymmetry, or associated skin lesions. No contractures.  Skin & Extremity Inspection: Atrophy  Functional ROM: Unrestricted ROM                  Functional ROM: Decreased ROM for all joints of the lower extremity          Muscle Tone/Strength: Functionally intact. No obvious neuro-muscular anomalies  detected.  Muscle Tone/Strength: Functionally intact. No obvious neuro-muscular anomalies detected.  Sensory (Neurological): Musculoskeletal pain pattern        Sensory (Neurological): Dermatomal pain pattern        DTR: Patellar: 0: absent Achilles: deferred today Plantar: deferred today  DTR: Patellar: 0: absent Achilles: deferred today Plantar: deferred today  Palpation: No palpable anomalies  Palpation: No palpable anomalies   Assessment  Primary Diagnosis & Pertinent Problem List: The primary encounter diagnosis was Lumbar facet arthropathy. Diagnoses of Lumbar degenerative disc disease, Ulnar neuropathy at elbow, left, Elbow fracture, left, with delayed healing, subsequent encounter, Cervico-occipital neuralgia, Moderate episode of recurrent major depressive disorder ([place]), Cubital tunnel syndrome on left, Neuropathic pain, and Fibromyalgia were also pertinent to this visit.  Visit Diagnosis (New problems to examiner): 1. Lumbar facet arthropathy   2. Lumbar degenerative disc disease   3. Ulnar neuropathy at elbow, left   4. Elbow fracture, left, with delayed healing, subsequent encounter   5. Cervico-occipital neuralgia   6. Moderate episode of recurrent major depressive disorder (H[facility])   7. Cubital tunnel syndrome on left   8. Neuropathic pain   9. Fibromyalgia    68 year old female with a complex medical history including multiple left elbow surgeries for close displaced avulsion fracture of the lateral epicondyle of the left humerus.  Patient has had severe complications from the surgery resulting in multiple subsequent revision surgeries.  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  Patient has limited range of motion of her left elbow and limited flexion and extension of her arms.  She also describes severe burning and tingling in her hand along with cramping pain.  Patient does have persistent left ulnar neuropathy  at the elbow.  She is unable to flex her left pinky.  Patient also has a history of thoracic compression fractures status post T8 kyphoplasty with Dr. Rudene Christians.  She also has axial low back pain with occasional radiation to bilateral lower extremities.  She has difficulty walking.  She ambulates with a cane.  She is scared of falling.  She has tried L5-S1 transforaminal epidural steroid injections in the past without any significant benefit.  Patient denies having tried facet blocks for her axial low back pain.  Patient does have generalized anxiety and takes Xanax 1 mg twice daily as needed which is managed by her psychiatrist.  Patient is also on high-dose gabapentin, 800 mg 4 times daily, Mobic 15 mg twice daily, Robaxin 750 mg 3 times daily along with Zoloft to manage her depression.  She denies being on a blood thinner.  In regards to therapeutic options, we discussed diagnostic lumbar facet medial branch nerve blocks at L3, L4, L5, S1.  VINETA CARONE has a history of greater than 3 months of moderate to severe pain which is resulted in functional impairment.  The patient has tried various conservative therapeutic options such as NSAIDs, Tylenol, muscle relaxants, physical therapy which was inadequately effective.  Patient's pain is predominantly axial with physical exam findings suggestive of facet arthropathy.  Lumbar facet medial branch nerve blocks were discussed with the patient.  Risks and benefits were reviewed.  Patient would like to proceed with bilateral L3, L4, L5 medial branch nerve block.    If diagnostic lumbar facet medial branch nerve blocks are effective, can consider radiofrequency ablation.  In regards to medication management, an extensive discussion with the patient regarding our clinic policy.  The patient is on Xanax.  She will need to discontinue this medication with the help of her primary care physician and find an alternative to manage her anxiety.  I offered to refer the  patient to psychiatry however she declined and stated that she and her primary care physician will try to find an alternative to Xanax.  We will hold off on obtaining a UDS at this time since he will be positive for Xanax.  Once the patient has discontinued Xanax, confirmed by no fills on [place] PMP, will obtain UDS and if appropriate can consider low-dose mild opioid therapy in the form of buprenorphine or tramadol.  Plan: -Bilateral lumbar facet medial branch nerve blocks at L3, L4, L5, S1 as above for lumbar facet arthropathy and spondylosis -Patient we need to discuss alternative to Xanax with her primary care physician.  If able to successfully wean off and discontinue Xanax, will obtain urine drug screen and so long as appropriate can consider low-dose opioid therapy in the form of buprenorphine.  Patient will not be a candidate for chronic opioid therapy so long as she is on Xanax.   Note: Please be advised that as per protocol, today's visit has been an evaluation only. We have not taken over the patient's controlled substance management.  Ordered Lab-work, Procedure(s), Referral(s), & Consult(s): Orders Placed This Encounter  Procedures  . LUMBAR FACET(MEDIAL BRANCH NERVE BLOCK) MBNB   Interventional management options: Ms. Hendricksen was informed that there is no guarantee that she would be a candidate for interventional therapies. The decision will be based on the results of diagnostic studies, as well as Ms. Lorusso's risk profile.  Procedure(s) under consideration:  Diagnostic lumbar facet medial branch nerve blocks Lumbar radiofrequency ablation   Provider-requested follow-up: Return for Procedure.  Future Appointments  Date Time Provider [place]  08/12/2018  1:15 PM Gillis Santa, MD [facility] None    Primary Care Physician: Tracie Harrier, MD Location: [facility] Note by: Gillis Santa, M.D, Date: 07/30/2018; Time: 3:47 PM  Patient  Instructions  In order to be considered for chronic opioid therapy, you will need to find an alternative to Xanax.  Please discuss with Dr. Ginette Pitman.  If you are able to successfully discontinue your Xanax we will need to obtain a urine drug screen that will need to be negative for Xanax before we can consider chronic opioid therapy.  For your low back pain we can try lumbar facet medial branch nerve blocks.  If effective can consider lumbar radiofrequency ablation.  ____________________________________________________________________________________________  Preparing for Procedure with Sedation  Instructions: . Oral Intake: Do not eat or drink anything for at least 8 hours prior to your procedure. . Transportation: Public transportation is not allowed. Bring an adult driver. The driver must be physically present in our waiting room before any procedure can be started. Marland Kitchen Physical Assistance: Bring an adult physically capable of assisting you, in the event you need help. This adult should keep you company at home for at least 6 hours after the procedure. . Blood Pressure Medicine: Take your blood pressure medicine with a sip of water the morning of the procedure. . Blood thinners: Notify our staff if you are taking any blood thinners. Depending on which one you take, there will be specific instructions on how and when to stop it. . Diabetics on insulin: Notify the staff so that you can be scheduled 1st case in the morning. If your diabetes requires high dose insulin, take only  of your normal insulin dose  the morning of the procedure and notify the staff that you have done so. . Preventing infections: Shower with an antibacterial soap the morning of your procedure. . Build-up your immune system: Take 1000 mg of Vitamin C with every meal (3 times a day) the day prior to your procedure. Marland Kitchen Antibiotics: Inform the staff if you have a condition or reason that requires you to take antibiotics before dental  procedures. . Pregnancy: If you are pregnant, call and cancel the procedure. . Sickness: If you have a cold, fever, or any active infections, call and cancel the procedure. . Arrival: You must be in the facility at least 30 minutes prior to your scheduled procedure. . Children: Do not bring children with you. . Dress appropriately: Bring dark clothing that you would not mind if they get stained. . Valuables: Do not bring any jewelry or valuables.  Procedure appointments are reserved for interventional treatments only. Marland Kitchen No Prescription Refills. . No medication changes will be discussed during procedure appointments. . No disability issues will be discussed.  Reasons to call and reschedule or cancel your procedure: (Following these recommendations will minimize the risk of a serious complication.) . Surgeries: Avoid having procedures within 2 weeks of any surgery. (Avoid for 2 weeks before or after any surgery). . Flu Shots: Avoid having procedures within 2 weeks of a flu shots or . (Avoid for 2 weeks before or after immunizations). . Barium: Avoid having a procedure within 7-10 days after having had a radiological study involving the use of radiological contrast. (Myelograms, Barium swallow or enema study). . Heart attacks: Avoid any elective procedures or surgeries for the initial 6 months after a "Myocardial Infarction" (Heart Attack). . Blood thinners: It is imperative that you stop these medications before procedures. Let us know if you if you take any blood thinner.  . Infection: Avoid procedures during or within two weeks of an infection (including chest colds or gastrointestinal problems). Symptoms associated with infections include: Localized redness, fever, chills, night sweats or profuse sweating, burning sensation when voiding, cough, congestion, stuffiness, runny nose, sore throat, diarrhea, nausea, vomiting, cold or Flu symptoms, recent or current infections. It is specially important  if the infection is over the area that we intend to treat. Marland Kitchen Heart and lung problems: Symptoms that may suggest an active cardiopulmonary problem include: cough, chest pain, breathing difficulties or shortness of breath, dizziness, ankle swelling, uncontrolled high or unusually low blood pressure, and/or palpitations. If you are experiencing any of these symptoms, cancel your procedure and contact your primary care physician for an evaluation.  Remember:  Regular Business hours are:  Monday to Thursday 8:00 AM to 4:00 PM  Provider's Schedule: Milinda Pointer, MD:  Procedure days: Tuesday and Thursday 7:30 AM to 4:00 PM  Gillis Santa, MD:  Procedure days: Monday and Wednesday 7:30 AM to 4:00 PM ____________________________________________________________________________________________  Facet Blocks Patient Information  Description: The facets are joints in the spine between the vertebrae.  Like any joints in the body, facets can become irritated and painful.  Arthritis can also effect the facets.  By injecting steroids and local anesthetic in and around these joints, we can temporarily block the nerve supply to them.  Steroids act directly on irritated nerves and tissues to reduce selling and inflammation which often leads to decreased pain.  Facet blocks may be done anywhere along the spine from the neck to the low back depending upon the location of your pain.   After numbing the skin with  local anesthetic (like Novocaine), a small needle is passed onto the facet joints under x-ray guidance.  You may experience a sensation of pressure while this is being done.  The entire block usually lasts about 15-25 minutes.   Conditions which may be treated by facet blocks:   Low back/buttock pain  Neck/shoulder pain  Certain types of headaches  Preparation for the injection:  1. Do not eat any solid food or dairy products within 8 hours of your appointment. 2. You may drink clear liquid up  to 3 hours before appointment.  Clear liquids include water, black coffee, juice or soda.  No milk or cream please. 3. You may take your regular medication, including pain medications, with a sip of water before your appointment.  Diabetics should hold regular insulin (if taken separately) and take 1/2 normal NPH dose the morning of the procedure.  Carry some sugar containing items with you to your appointment. 4. A driver must accompany you and be prepared to drive you home after your procedure. 5. Bring all your current medications with you. 6. An IV may be inserted and sedation may be given at the discretion of the physician. 7. A blood pressure cuff, EKG and other monitors will often be applied during the procedure.  Some patients may need to have extra oxygen administered for a short period. 8. You will be asked to provide medical information, including your allergies and medications, prior to the procedure.  We must know immediately if you are taking blood thinners (like Coumadin/Warfarin) or if you are allergic to IV iodine contrast (dye).  We must know if you could possible be pregnant.  Possible side-effects:   Bleeding from needle site  Infection (rare, may require surgery)  Nerve injury (rare)  Numbness & tingling (temporary)  Difficulty urinating (rare, temporary)  Spinal headache (a headache worse with upright posture)  Light-headedness (temporary)  Pain at injection site (serveral days)  Decreased blood pressure (rare, temporary)  Weakness in arm/leg (temporary)  Pressure sensation in back/neck (temporary)   Call if you experience:   Fever/chills associated with headache or increased back/neck pain  Headache worsened by an upright position  New onset, weakness or numbness of an extremity below the injection site  Hives or difficulty breathing (go to the emergency room)  Inflammation or drainage at the injection site(s)  Severe back/neck pain greater than  usual  New symptoms which are concerning to you  Please note:  Although the local anesthetic injected can often make your back or neck feel good for several hours after the injection, the pain will likely return. It takes 3-7 days for steroids to work.  You may not notice any pain relief for at least one week.  If effective, we will often do a series of 2-3 injections spaced 3-6 weeks apart to maximally decrease your pain.  After the initial series, you may be a candidate for a more permanent nerve block of the facets.  If you have any questions, please call #[phone]) [facility]

## 2018-07-30 NOTE — Progress Notes (Signed)
Safety precautions to be maintained throughout the outpatient stay will include: orient to surroundings, keep bed in low position, maintain call bell within reach at all times, provide assistance with transfer out of bed and ambulation.  

## 2018-08-04 DIAGNOSIS — R4189 Other symptoms and signs involving cognitive functions and awareness: Secondary | ICD-10-CM | POA: Diagnosis not present

## 2018-08-04 DIAGNOSIS — R51 Headache: Secondary | ICD-10-CM | POA: Diagnosis not present

## 2018-08-04 DIAGNOSIS — M5481 Occipital neuralgia: Secondary | ICD-10-CM | POA: Diagnosis not present

## 2018-08-04 DIAGNOSIS — D7589 Other specified diseases of blood and blood-forming organs: Secondary | ICD-10-CM | POA: Diagnosis not present

## 2018-08-04 DIAGNOSIS — G43719 Chronic migraine without aura, intractable, without status migrainosus: Secondary | ICD-10-CM | POA: Diagnosis not present

## 2018-08-06 DIAGNOSIS — G43719 Chronic migraine without aura, intractable, without status migrainosus: Secondary | ICD-10-CM | POA: Diagnosis not present

## 2018-08-10 DIAGNOSIS — Z86010 Personal history of colonic polyps: Secondary | ICD-10-CM | POA: Diagnosis not present

## 2018-08-10 DIAGNOSIS — R197 Diarrhea, unspecified: Secondary | ICD-10-CM | POA: Diagnosis not present

## 2018-08-10 DIAGNOSIS — R1310 Dysphagia, unspecified: Secondary | ICD-10-CM | POA: Diagnosis not present

## 2018-08-10 DIAGNOSIS — D7589 Other specified diseases of blood and blood-forming organs: Secondary | ICD-10-CM | POA: Diagnosis not present

## 2018-08-10 DIAGNOSIS — K219 Gastro-esophageal reflux disease without esophagitis: Secondary | ICD-10-CM | POA: Diagnosis not present

## 2018-08-12 ENCOUNTER — Ambulatory Visit
Admission: RE | Admit: 2018-08-12 | Discharge: 2018-08-12 | Disposition: A | Discharge status: Home / Self Care | Payer: PPO | Source: Ambulatory Visit | Attending: Student in an Organized Health Care Education/Training Program | Admitting: Student in an Organized Health Care Education/Training Program

## 2018-08-12 ENCOUNTER — Encounter: Payer: Self-pay | Admitting: Student in an Organized Health Care Education/Training Program

## 2018-08-12 ENCOUNTER — Ambulatory Visit (HOSPITAL_BASED_OUTPATIENT_CLINIC_OR_DEPARTMENT_OTHER): Payer: PPO | Admitting: Student in an Organized Health Care Education/Training Program

## 2018-08-12 ENCOUNTER — Other Ambulatory Visit: Payer: Self-pay

## 2018-08-12 VITALS — BP 182/73 | HR 84 | Temp 98.3°F | Resp 16 | Ht 64.5 in | Wt 169.0 lb

## 2018-08-12 DIAGNOSIS — G894 Chronic pain syndrome: Secondary | ICD-10-CM | POA: Diagnosis not present

## 2018-08-12 DIAGNOSIS — M5136 Other intervertebral disc degeneration, lumbar region: Secondary | ICD-10-CM

## 2018-08-12 DIAGNOSIS — M47816 Spondylosis without myelopathy or radiculopathy, lumbar region: Secondary | ICD-10-CM | POA: Diagnosis not present

## 2018-08-12 DIAGNOSIS — M51369 Other intervertebral disc degeneration, lumbar region without mention of lumbar back pain or lower extremity pain: Secondary | ICD-10-CM

## 2018-08-12 MED ORDER — LACTATED RINGERS IV SOLN
1000.0000 mL | Freq: Once | INTRAVENOUS | Status: AC
  Administered 2018-08-12: 1000 mL via INTRAVENOUS

## 2018-08-12 MED ORDER — DEXAMETHASONE SODIUM PHOSPHATE 10 MG/ML IJ SOLN
20.0000 mg | Freq: Once | INTRAMUSCULAR | Status: AC
  Administered 2018-08-12: 20 mg
  Filled 2018-08-12: qty 2

## 2018-08-12 MED ORDER — LIDOCAINE HCL 2 % IJ SOLN
20.0000 mL | Freq: Once | INTRAMUSCULAR | Status: AC
  Administered 2018-08-12: 400 mg
  Filled 2018-08-12: qty 40

## 2018-08-12 MED ORDER — ROPIVACAINE HCL 2 MG/ML IJ SOLN
18.0000 mL | Freq: Once | INTRAMUSCULAR | Status: AC
  Administered 2018-08-12: 18 mL via PERINEURAL
  Filled 2018-08-12: qty 20

## 2018-08-12 MED ORDER — FENTANYL CITRATE (PF) 100 MCG/2ML IJ SOLN: 25.0000 ug | INTRAMUSCULAR | Status: DC | PRN

## 2018-08-12 MED ORDER — DEXAMETHASONE SODIUM PHOSPHATE 10 MG/ML IJ SOLN
10.0000 mg | Freq: Once | INTRAMUSCULAR | Status: DC
  Filled 2018-08-12: qty 1

## 2018-08-12 MED ORDER — MIDAZOLAM HCL 5 MG/5ML IJ SOLN
1.0000 mg | INTRAMUSCULAR | Status: DC | PRN
  Administered 2018-08-12: 2 mg via INTRAVENOUS
  Filled 2018-08-12: qty 5

## 2018-08-12 NOTE — Progress Notes (Signed)
Patient's Name: Cheyenne Grimes  MRN: [id number]  Referring Provider: Barbette Reichmann, MD  DOB: [birth year]  PCP: Barbette Reichmann, MD  DOS: 08/12/2018  Note by: Edward Jolly, MD  Service setting: Ambulatory outpatient  Specialty: Interventional Pain Management  Patient type: Established  Location: [facility] (AMB) Pain Management Facility  Visit type: Interventional Procedure   Primary Reason for Visit: Interventional Pain Management Treatment. CC: low back pain  Procedure:          Anesthesia, Analgesia, Anxiolysis:  Type: Lumbar Facet, Medial Branch Block(s)          Primary Purpose: Diagnostic Region: Posterolateral Lumbosacral Spine Level:  L3, L4, L5, & S1 Medial Branch Level(s). Injecting these levels blocks the L3-4, L4-5, and L5-S1 lumbar facet joints. Laterality: Bilateral  Type: Moderate (Conscious) Sedation combined with Local Anesthesia Indication(s): Analgesia and Anxiety Route: Intravenous (IV) IV Access: Secured Sedation: Meaningful verbal contact was maintained at all times during the procedure  Local Anesthetic: Lidocaine 1-2%  Position: Prone   Indications: 1. Lumbar facet arthropathy   2. Lumbar degenerative disc disease   3. Chronic pain syndrome    Pain Score: Pre-procedure: 7 /10 Post-procedure: 0-No pain/10  Pre-op Assessment:  Cheyenne Grimes is a 68 y.o. (year old), female patient, seen today for interventional treatment. She  has a past surgical history that includes Left shoulder surgery; Kyphoplasty; Replacement total knee (Left); Knee surgery (Right); arm surgery (Left); Wrist surgery (Bilateral); Cholecystectomy; Eye surgery; Cataract extraction (Bilateral); Kyphoplasty (N/A, 10/31/2015); and Breast biopsy (Left). Cheyenne Grimes has a current medication list which includes the following prescription(s): acetaminophen, calcium-vitamin d, gabapentin, garlic, levothyroxine, meloxicam, methocarbamol, fish oil, sennosides-docusate sodium, vitamin e, zolpidem, alprazolam,  fluticasone-salmeterol, sertraline, and tiotropium, and the following Facility-Administered Medications: dexamethasone and midazolam. Her primarily concern today is the Shoulder Pain (left) and Arm Pain (left)  Initial Vital Signs:  Pulse/HCG Rate: 84ECG Heart Rate: 87 Temp: 98.3 F (36.8 C) Resp: 18 BP: (!) 164/72 SpO2: 95 %  BMI: Estimated body mass index is 28.56 kg/m as calculated from the following:   Height as of this encounter: 5' 4.5" (1.638 m).   Weight as of this encounter: 169 lb (76.7 kg).  Risk Assessment: Allergies: Reviewed. She is allergic to ivp dye [iodinated diagnostic agents]; iodides; iodine; and penicillins.  Allergy Precautions: None required Coagulopathies: Reviewed. None identified.  Blood-thinner therapy: None at this time Active Infection(s): Reviewed. None identified. Cheyenne Grimes is afebrile  Site Confirmation: Cheyenne Grimes was asked to confirm the procedure and laterality before marking the site Procedure checklist: Completed Consent: Before the procedure and under the influence of no sedative(s), amnesic(s), or anxiolytics, the patient was informed of the treatment options, risks and possible complications. To fulfill our ethical and legal obligations, as recommended by the American Medical Association's Code of Ethics, I have informed the patient of my clinical impression; the nature and purpose of the treatment or procedure; the risks, benefits, and possible complications of the intervention; the alternatives, including doing nothing; the risk(s) and benefit(s) of the alternative treatment(s) or procedure(s); and the risk(s) and benefit(s) of doing nothing. The patient was provided information about the general risks and possible complications associated with the procedure. These may include, but are not limited to: failure to achieve desired goals, infection, bleeding, organ or nerve damage, allergic reactions, paralysis, and death. In addition, the patient was  informed of those risks and complications associated to Spine-related procedures, such as failure to decrease pain; infection (i.e.: Meningitis, epidural or intraspinal abscess); bleeding (  i.e.: epidural hematoma, subarachnoid hemorrhage, or any other type of intraspinal or peri-dural bleeding); organ or nerve damage (i.e.: Any type of peripheral nerve, nerve root, or spinal cord injury) with subsequent damage to sensory, motor, and/or autonomic systems, resulting in permanent pain, numbness, and/or weakness of one or several areas of the body; allergic reactions; (i.e.: anaphylactic reaction); and/or death. Furthermore, the patient was informed of those risks and complications associated with the medications. These include, but are not limited to: allergic reactions (i.e.: anaphylactic or anaphylactoid reaction(s)); adrenal axis suppression; blood sugar elevation that in diabetics may result in ketoacidosis or comma; water retention that in patients with history of congestive heart failure may result in shortness of breath, pulmonary edema, and decompensation with resultant heart failure; weight gain; swelling or edema; medication-induced neural toxicity; particulate matter embolism and blood vessel occlusion with resultant organ, and/or nervous system infarction; and/or aseptic necrosis of one or more joints. Finally, the patient was informed that Medicine is not an exact science; therefore, there is also the possibility of unforeseen or unpredictable risks and/or possible complications that may result in a catastrophic outcome. The patient indicated having understood very clearly. We have given the patient no guarantees and we have made no promises. Enough time was given to the patient to ask questions, all of which were answered to the patient's satisfaction. Cheyenne Grimes has indicated that she wanted to continue with the procedure. Attestation: I, the ordering provider, attest that I have discussed with the  patient the benefits, risks, side-effects, alternatives, likelihood of achieving goals, and potential problems during recovery for the procedure that I have provided informed consent. Date  Time: 08/12/2018 12:45 PM  Pre-Procedure Preparation:  Monitoring: As per clinic protocol. Respiration, ETCO2, SpO2, BP, heart rate and rhythm monitor placed and checked for adequate function Safety Precautions: Patient was assessed for positional comfort and pressure points before starting the procedure. Time-out: I initiated and conducted the "Time-out" before starting the procedure, as per protocol. The patient was asked to participate by confirming the accuracy of the "Time Out" information. Verification of the correct person, site, and procedure were performed and confirmed by me, the nursing staff, and the patient. "Time-out" conducted as per Joint Commission's Universal Protocol (UP.01.01.01). Time: 1330  Description of Procedure:          Laterality: Bilateral. The procedure was performed in identical fashion on both sides. Levels:  L3, L4, L5, & S1 Medial Branch Level(s) Area Prepped: Posterior Lumbosacral Region Prepping solution: ChloraPrep (2% chlorhexidine gluconate and 70% isopropyl alcohol) Safety Precautions: Aspiration looking for blood return was conducted prior to all injections. At no point did we inject any substances, as a needle was being advanced. Before injecting, the patient was told to immediately notify me if she was experiencing any new onset of "ringing in the ears, or metallic taste in the mouth". No attempts were made at seeking any paresthesias. Safe injection practices and needle disposal techniques used. Medications properly checked for expiration dates. SDV (single dose vial) medications used. After the completion of the procedure, all disposable equipment used was discarded in the proper designated medical waste containers. Local Anesthesia: Protocol guidelines were followed.  The patient was positioned over the fluoroscopy table. The area was prepped in the usual manner. The time-out was completed. The target area was identified using fluoroscopy. A 12-in long, straight, sterile hemostat was used with fluoroscopic guidance to locate the targets for each level blocked. Once located, the skin was marked with an approved surgical  skin marker. Once all sites were marked, the skin (epidermis, dermis, and hypodermis), as well as deeper tissues (fat, connective tissue and muscle) were infiltrated with a small amount of a short-acting local anesthetic, loaded on a 10cc syringe with a 25G, 1.5-in  Needle. An appropriate amount of time was allowed for local anesthetics to take effect before proceeding to the next step. Local Anesthetic: Lidocaine 2.0% The unused portion of the local anesthetic was discarded in the proper designated containers. Technical explanation of process:   L3 Medial Branch Nerve Block (MBB): The target area for the L3 medial branch is at the junction of the postero-lateral aspect of the superior articular process and the superior, posterior, and medial edge of the transverse process of L4. Under fluoroscopic guidance, a Quincke needle was inserted until contact was made with os over the superior postero-lateral aspect of the pedicular shadow (target area). After negative aspiration for blood, 1 mL of the nerve block solution was injected without difficulty or complication. The needle was removed intact. L4 Medial Branch Nerve Block (MBB): The target area for the L4 medial branch is at the junction of the postero-lateral aspect of the superior articular process and the superior, posterior, and medial edge of the transverse process of L5. Under fluoroscopic guidance, a Quincke needle was inserted until contact was made with os over the superior postero-lateral aspect of the pedicular shadow (target area). After negative aspiration for blood, 1 mL of the nerve block  solution was injected without difficulty or complication. The needle was removed intact. L5 Medial Branch Nerve Block (MBB): The target area for the L5 medial branch is at the junction of the postero-lateral aspect of the superior articular process and the superior, posterior, and medial edge of the sacral ala. Under fluoroscopic guidance, a Quincke needle was inserted until contact was made with os over the superior postero-lateral aspect of the pedicular shadow (target area). After negative aspiration for blood, 63mL of the nerve block solution was injected without difficulty or complication. The needle was removed intact. S1 Medial Branch Nerve Block (MBB): The target area for the S1 medial branch is at the posterior and inferior 6 o'clock position of the L5-S1 facet joint. Under fluoroscopic guidance, the Quincke needle inserted for the L5 MBB was redirected until contact was made with os over the inferior and postero aspect of the sacrum, at the 6 o' clock position under the L5-S1 facet joint (Target area). After negative aspiration for blood,1 mL of the nerve block solution was injected without difficulty or complication. The needle was removed intact.  Nerve block solution: 8 cc solution made of 7 cc of 0.2% ropivacaine, 1 cc of Decadron 10 mg/cc.  1 cc injected at each level above bilaterally. Procedural Needles: 22-gauge, 3.5-inch, Quincke needles used for all levels.  Once the entire procedure was completed, the treated area was cleaned, making sure to leave some of the prepping solution back to take advantage of its long term bactericidal properties.   Illustration of the posterior view of the lumbar spine and the posterior neural structures. Laminae of L2 through S1 are labeled. DPRL5, dorsal primary ramus of L5; DPRS1, dorsal primary ramus of S1; DPR3, dorsal primary ramus of L3; FJ, facet (zygapophyseal) joint L3-L4; I, inferior articular process of L4; LB1, lateral branch of dorsal primary  ramus of L1; IAB, inferior articular branches from L3 medial branch (supplies L4-L5 facet joint); IBP, intermediate branch plexus; MB3, medial branch of dorsal primary ramus of L3; NR3, third  lumbar nerve root; S, superior articular process of L5; SAB, superior articular branches from L4 (supplies L4-5 facet joint also); TP3, transverse process of L3.  Vitals:   08/12/18 1359 08/12/18 1409 08/12/18 1420 08/12/18 1430  BP: (!) 163/72 (!) 179/78 (!) 186/72 (!) 182/73  Pulse:      Resp: 16 18 16    Temp:      TempSrc:      SpO2: 95% 96% 97% 96%  Weight:      Height:         Start Time: 1330 hrs. End Time: 1358 hrs.  Imaging Guidance (Spinal):          Type of Imaging Technique: Fluoroscopy Guidance (Spinal) Indication(s): Assistance in needle guidance and placement for procedures requiring needle placement in or near specific anatomical locations not easily accessible without such assistance. Exposure Time: Please see nurses notes. Contrast: None used. Fluoroscopic Guidance: I was personally present during the use of fluoroscopy. "Tunnel Vision Technique" used to obtain the best possible view of the target area. Parallax error corrected before commencing the procedure. "Direction-depth-direction" technique used to introduce the needle under continuous pulsed fluoroscopy. Once target was reached, antero-posterior, oblique, and lateral fluoroscopic projection used confirm needle placement in all planes. Images permanently stored in EMR. Interpretation: No contrast injected. I personally interpreted the imaging intraoperatively. Adequate needle placement confirmed in multiple planes. Permanent images saved into the patient's record.  Antibiotic Prophylaxis:   Anti-infectives (From admission, onward)   None     Indication(s): None identified  Post-operative Assessment:  Post-procedure Vital Signs:  Pulse/HCG Rate: 8475 Temp: 98.3 F (36.8 C) Resp: 16 BP: (!) 182/73 SpO2: 96 %  EBL:  None  Complications: No immediate post-treatment complications observed by team, or reported by patient.  Note: The patient tolerated the entire procedure well. A repeat set of vitals were taken after the procedure and the patient was kept under observation following institutional policy, for this type of procedure. Post-procedural neurological assessment was performed, showing return to baseline, prior to discharge. The patient was provided with post-procedure discharge instructions, including a section on how to identify potential problems. Should any problems arise concerning this procedure, the patient was given instructions to immediately contact us, at any time, without hesitation. In any case, we plan to contact the patient by telephone for a follow-up status report regarding this interventional procedure.  Comments:  No additional relevant information.  Plan of Care   Imaging Orders     DG C-Arm 1-60 Min-No Report Procedure Orders    No procedure(s) ordered today    Medications ordered for procedure: Meds ordered this encounter  Medications  . DISCONTD: fentaNYL (SUBLIMAZE) injection 25-50 mcg    Make sure Narcan is available in the pyxis when using this medication. In the event of respiratory depression (RR< 8/min): Titrate NARCAN (naloxone) in increments of 0.1 to 0.2 mg IV at 2-3 minute intervals, until desired degree of reversal.  . lidocaine (XYLOCAINE) 2 % (with pres) injection 400 mg  . ropivacaine (PF) 2 mg/mL (0.2%) (NAROPIN) injection 18 mL  . dexamethasone (DECADRON) injection 20 mg  . dexamethasone (DECADRON) injection 10 mg  . lactated ringers infusion 1,000 mL  . midazolam (VERSED) 5 MG/5ML injection 1-2 mg    Make sure Flumazenil is available in the pyxis when using this medication. If oversedation occurs, administer 0.2 mg IV over 15 sec. If after 45 sec no response, administer 0.2 mg again over 1 min; may repeat at 1 min intervals;  not to exceed 4 doses (1 mg)    Medications administered: We administered lidocaine, ropivacaine (PF) 2 mg/mL (0.2%), dexamethasone, lactated ringers, and midazolam.  See the medical record for exact dosing, route, and time of administration.  Disposition: Discharge home  Discharge Date & Time: 08/12/2018; 1432 hrs.   Physician-requested Follow-up: Return in about 4 weeks (around 09/09/2018) for Post Procedure Evaluation.  Future Appointments  Date Time Provider Department Center  09/10/2018 12:15 PM Edward Jolly, MD [facility] None   Primary Care Physician: Barbette Reichmann, MD Location: [facility] Outpatient Pain Management Facility Note by: Edward Jolly, MD Date: 08/12/2018; Time: 2:38 PM  Disclaimer:  Medicine is not an exact science. The only guarantee in medicine is that nothing is guaranteed. It is important to note that the decision to proceed with this intervention was based on the information collected from the patient. The Data and conclusions were drawn from the patient's questionnaire, the interview, and the physical examination. Because the information was provided in large part by the patient, it cannot be guaranteed that it has not been purposely or unconsciously manipulated. Every effort has been made to obtain as much relevant data as possible for this evaluation. It is important to note that the conclusions that lead to this procedure are derived in large part from the available data. Always take into account that the treatment will also be dependent on availability of resources and existing treatment guidelines, considered by other Pain Management Practitioners as being common knowledge and practice, at the time of the intervention. For Medico-Legal purposes, it is also important to point out that variation in procedural techniques and pharmacological choices are the acceptable norm. The indications, contraindications, technique, and results of the above procedure should only be interpreted and judged by a  Board-Certified Interventional Pain Specialist with extensive familiarity and expertise in the same exact procedure and technique.

## 2018-08-12 NOTE — Patient Instructions (Signed)

## 2018-08-12 NOTE — Progress Notes (Signed)
Safety precautions to be maintained throughout the outpatient stay will include: orient to surroundings, keep bed in low position, maintain call bell within reach at all times, provide assistance with transfer out of bed and ambulation.  

## 2018-08-16 LAB — COMPLIANCE DRUG ANALYSIS, UR

## 2018-08-19 ENCOUNTER — Other Ambulatory Visit: Payer: Self-pay

## 2018-08-19 ENCOUNTER — Ambulatory Visit: Payer: PPO | Admitting: Registered Nurse

## 2018-08-19 ENCOUNTER — Encounter: Payer: Self-pay | Admitting: Emergency Medicine

## 2018-08-19 ENCOUNTER — Encounter
Admission: RE | Disposition: A | Discharge status: Home / Self Care | Payer: Self-pay | Source: Home / Self Care | Attending: Internal Medicine

## 2018-08-19 ENCOUNTER — Ambulatory Visit
Admission: RE | Admit: 2018-08-19 | Discharge: 2018-08-19 | Disposition: A | Discharge status: Home / Self Care | Payer: PPO | Attending: Internal Medicine | Admitting: Internal Medicine

## 2018-08-19 DIAGNOSIS — Z7951 Long term (current) use of inhaled steroids: Secondary | ICD-10-CM | POA: Insufficient documentation

## 2018-08-19 DIAGNOSIS — Z791 Long term (current) use of non-steroidal anti-inflammatories (NSAID): Secondary | ICD-10-CM | POA: Insufficient documentation

## 2018-08-19 DIAGNOSIS — K5730 Diverticulosis of large intestine without perforation or abscess without bleeding: Secondary | ICD-10-CM | POA: Diagnosis not present

## 2018-08-19 DIAGNOSIS — K2950 Unspecified chronic gastritis without bleeding: Secondary | ICD-10-CM | POA: Insufficient documentation

## 2018-08-19 DIAGNOSIS — R1013 Epigastric pain: Secondary | ICD-10-CM | POA: Insufficient documentation

## 2018-08-19 DIAGNOSIS — K3189 Other diseases of stomach and duodenum: Secondary | ICD-10-CM | POA: Diagnosis not present

## 2018-08-19 DIAGNOSIS — Z86010 Personal history of colonic polyps: Secondary | ICD-10-CM | POA: Diagnosis not present

## 2018-08-19 DIAGNOSIS — K319 Disease of stomach and duodenum, unspecified: Secondary | ICD-10-CM | POA: Diagnosis not present

## 2018-08-19 DIAGNOSIS — K648 Other hemorrhoids: Secondary | ICD-10-CM | POA: Diagnosis not present

## 2018-08-19 DIAGNOSIS — Z79890 Hormone replacement therapy (postmenopausal): Secondary | ICD-10-CM | POA: Diagnosis not present

## 2018-08-19 DIAGNOSIS — K2970 Gastritis, unspecified, without bleeding: Secondary | ICD-10-CM | POA: Diagnosis not present

## 2018-08-19 DIAGNOSIS — R1310 Dysphagia, unspecified: Secondary | ICD-10-CM | POA: Diagnosis not present

## 2018-08-19 DIAGNOSIS — K640 First degree hemorrhoids: Secondary | ICD-10-CM | POA: Diagnosis not present

## 2018-08-19 DIAGNOSIS — R197 Diarrhea, unspecified: Secondary | ICD-10-CM | POA: Diagnosis not present

## 2018-08-19 DIAGNOSIS — K31819 Angiodysplasia of stomach and duodenum without bleeding: Secondary | ICD-10-CM | POA: Diagnosis not present

## 2018-08-19 DIAGNOSIS — F17200 Nicotine dependence, unspecified, uncomplicated: Secondary | ICD-10-CM | POA: Insufficient documentation

## 2018-08-19 DIAGNOSIS — M797 Fibromyalgia: Secondary | ICD-10-CM | POA: Diagnosis not present

## 2018-08-19 DIAGNOSIS — Z96652 Presence of left artificial knee joint: Secondary | ICD-10-CM | POA: Insufficient documentation

## 2018-08-19 DIAGNOSIS — F329 Major depressive disorder, single episode, unspecified: Secondary | ICD-10-CM | POA: Diagnosis not present

## 2018-08-19 DIAGNOSIS — K591 Functional diarrhea: Secondary | ICD-10-CM | POA: Insufficient documentation

## 2018-08-19 DIAGNOSIS — Z79899 Other long term (current) drug therapy: Secondary | ICD-10-CM | POA: Diagnosis not present

## 2018-08-19 DIAGNOSIS — K5790 Diverticulosis of intestine, part unspecified, without perforation or abscess without bleeding: Secondary | ICD-10-CM | POA: Diagnosis not present

## 2018-08-19 DIAGNOSIS — J449 Chronic obstructive pulmonary disease, unspecified: Secondary | ICD-10-CM | POA: Insufficient documentation

## 2018-08-19 DIAGNOSIS — Z7689 Persons encountering health services in other specified circumstances: Secondary | ICD-10-CM | POA: Diagnosis not present

## 2018-08-19 HISTORY — PX: COLONOSCOPY WITH PROPOFOL: SHX5780

## 2018-08-19 HISTORY — PX: ESOPHAGOGASTRODUODENOSCOPY (EGD) WITH PROPOFOL: SHX5813

## 2018-08-19 SURGERY — COLONOSCOPY WITH PROPOFOL
Anesthesia: General

## 2018-08-19 MED ORDER — GLYCOPYRROLATE 0.2 MG/ML IJ SOLN
INTRAMUSCULAR | Status: AC
  Filled 2018-08-19: qty 1

## 2018-08-19 MED ORDER — SODIUM CHLORIDE 0.9 % IV SOLN
INTRAVENOUS | Status: DC
  Administered 2018-08-19: 1000 mL via INTRAVENOUS

## 2018-08-19 MED ORDER — PROPOFOL 10 MG/ML IV BOLUS
INTRAVENOUS | Status: DC | PRN
  Administered 2018-08-19: 30 mg via INTRAVENOUS
  Administered 2018-08-19: 70 mg via INTRAVENOUS

## 2018-08-19 MED ORDER — PROPOFOL 500 MG/50ML IV EMUL
INTRAVENOUS | Status: AC
  Filled 2018-08-19: qty 50

## 2018-08-19 MED ORDER — LIDOCAINE HCL (PF) 2 % IJ SOLN
INTRAMUSCULAR | Status: AC
  Filled 2018-08-19: qty 10

## 2018-08-19 MED ORDER — PROPOFOL 500 MG/50ML IV EMUL
INTRAVENOUS | Status: DC | PRN
  Administered 2018-08-19: 140 ug/kg/min via INTRAVENOUS

## 2018-08-19 MED ORDER — LIDOCAINE HCL (PF) 1 % IJ SOLN
INTRAMUSCULAR | Status: AC
  Filled 2018-08-19: qty 2

## 2018-08-19 MED ORDER — FENTANYL CITRATE (PF) 250 MCG/5ML IJ SOLN
INTRAMUSCULAR | Status: DC | PRN
  Administered 2018-08-19: 50 ug via INTRAVENOUS
  Administered 2018-08-19 (×2): 25 ug via INTRAVENOUS

## 2018-08-19 MED ORDER — LIDOCAINE HCL (CARDIAC) PF 100 MG/5ML IV SOSY
PREFILLED_SYRINGE | INTRAVENOUS | Status: DC | PRN
  Administered 2018-08-19: 60 mg via INTRAVENOUS

## 2018-08-19 MED ORDER — FENTANYL CITRATE (PF) 100 MCG/2ML IJ SOLN
INTRAMUSCULAR | Status: AC
  Filled 2018-08-19: qty 2

## 2018-08-19 MED ORDER — GLYCOPYRROLATE 0.2 MG/ML IJ SOLN
INTRAMUSCULAR | Status: DC | PRN
  Administered 2018-08-19: 0.2 mg via INTRAVENOUS

## 2018-08-19 MED ORDER — EPHEDRINE SULFATE 50 MG/ML IJ SOLN
INTRAMUSCULAR | Status: AC
  Filled 2018-08-19: qty 1

## 2018-08-19 NOTE — Op Note (Signed)
[facility] Gastroenterology Patient Name: Cheyenne Grimes Procedure Date: 08/19/2018 9:38 AM MRN: [id number] Account #: [id number] Date of Birth: 1950/09/26 Admit Type: Outpatient Age: 68 Room: [facility] ENDO ROOM 2 Gender: Female Note Status: Finalized Procedure:            Upper GI endoscopy Indications:          Epigastric abdominal pain, Dysphagia Providers:            Boykin Nearing. Norma Fredrickson MD, MD Referring MD:         Barbette Reichmann, MD (Referring MD) Medicines:            Propofol per Anesthesia Complications:        No immediate complications. Procedure:            Pre-Anesthesia Assessment:                       - The risks and benefits of the procedure and the                        sedation options and risks were discussed with the                        patient. All questions were answered and informed                        consent was obtained.                       - Patient identification and proposed procedure were                        verified prior to the procedure by the nurse. The                        procedure was verified in the procedure room.                       - ASA Grade Assessment: III - A patient with severe                        systemic disease.                       - After reviewing the risks and benefits, the patient                        was deemed in satisfactory condition to undergo the                        procedure in an ambulatory setting.                       After obtaining informed consent, the endoscope was                        passed under direct vision. Throughout the procedure,                        the patient's blood pressure, pulse, and oxygen  saturations were monitored continuously. The Endoscope                        was introduced through the mouth, and advanced to the                        third part of duodenum. The upper GI endoscopy was                        accomplished without  difficulty. The patient tolerated                        the procedure well. Findings:      No endoscopic abnormality was evident in the esophagus to explain the       patient's complaint of dysphagia. It was decided, however, to proceed       with dilation in the distal esophagus. The scope was withdrawn. Dilation       was performed with a Maloney dilator with mild resistance at 54 Fr.      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 Biopsies were taken with a cold forceps for       histology.      The cardia and gastric fundus were normal on retroflexion.      The examined duodenum was normal.      The exam was otherwise without abnormality. Impression:           - No endoscopic esophageal abnormality to explain                        patient's dysphagia. Esophagus dilated. Dilated.                       - Gastric antral vascular ectasia without bleeding.                        Biopsied.                       - Normal examined duodenum.                       - The examination was otherwise normal. Recommendation:       - Await pathology results.                       - Monitor results to esophageal dilation                       - Proceed with colonoscopy Procedure Code(s):    --- Professional ---                       [phone], Esophagogastroduodenoscopy, flexible, transoral;                        with biopsy, single or multiple                       43450, Dilation of esophagus, by unguided sound or  bougie, single or multiple passes Diagnosis Code(s):    --- Professional ---                       R10.13, Epigastric pain                       K31.819, Angiodysplasia of stomach and duodenum without                        bleeding                       R13.10, Dysphagia, unspecified CPT copyright 2018 American Medical Association. All rights reserved. The codes documented in this report are preliminary and  upon coder review may  be revised to meet current compliance requirements. [name] MD, MD 08/19/2018 10:37:56 AM This report has been signed electronically. Number of Addenda: 0 Note Initiated On: 08/19/2018 9:38 AM      [facility]

## 2018-08-19 NOTE — Op Note (Signed)
[facility] Gastroenterology Patient Name: Cheyenne Grimes Procedure Date: 08/19/2018 9:37 AM MRN: [id number] Account #: [id number] Date of Birth: 1950/10/11 Admit Type: Outpatient Age: 68 Room: [facility] ENDO ROOM 2 Gender: Female Note Status: Finalized Procedure:            Colonoscopy Indications:          Functional diarrhea Providers:            Boykin Nearing. Jacksen Isip MD, MD Medicines:            Propofol per Anesthesia Complications:        No immediate complications. Procedure:            Pre-Anesthesia Assessment:                       - Prior to the procedure, a History and Physical was                        performed, and patient medications, allergies and                        sensitivities were reviewed. The patient's tolerance of                        previous anesthesia was reviewed.                       - The risks and benefits of the procedure and the                        sedation options and risks were discussed with the                        patient. All questions were answered and informed                        consent was obtained.                       - Patient identification and proposed procedure were                        verified prior to the procedure by the nurse. The                        procedure was verified in the procedure room.                       - ASA Grade Assessment: III - A patient with severe                        systemic disease.                       - After reviewing the risks and benefits, the patient                        was deemed in satisfactory condition to undergo the                        procedure.  After obtaining informed consent, the colonoscope was                        passed under direct vision. Throughout the procedure,                        the patient's blood pressure, pulse, and oxygen                        saturations were monitored continuously. The   Colonoscope was introduced through the anus and                        advanced to the the cecum, identified by appendiceal                        orifice and ileocecal valve. The colonoscopy was                        performed without difficulty. The patient tolerated the                        procedure well. The quality of the bowel preparation                        was good. The ileocecal valve, appendiceal orifice, and                        rectum were photographed. Findings:      The perianal and digital rectal examinations were normal. Pertinent       negatives include normal sphincter tone and no palpable rectal lesions.      Many small-mouthed diverticula were found in the entire colon.      Non-bleeding internal hemorrhoids were found during retroflexion. The       hemorrhoids were Grade I (internal hemorrhoids that do not prolapse).      Normal mucosa was found in the entire colon. Biopsies for histology were       taken with a cold forceps from the random colon for evaluation of       microscopic colitis.      The exam was otherwise without abnormality. Impression:           - Diverticulosis in the entire examined colon.                       - Non-bleeding internal hemorrhoids.                       - Normal mucosa in the entire examined colon. Biopsied.                       - The examination was otherwise normal. Recommendation:       - Await pathology results from EGD, also performed                        today.                       - Monitor results to esophageal dilation                       -  Patient has a contact number available for                        emergencies. The signs and symptoms of potential                        delayed complications were discussed with the patient.                        Return to normal activities tomorrow. Written discharge                        instructions were provided to the patient.                       - Resume previous  diet.                       - Continue present medications.                       - Repeat colonoscopy in 5 years for surveillance.                       - Patient has personal hx of colon polyps                       - The findings and recommendations were discussed with                        the patient and their family.                       - Return to physician assistant in 2 months. Procedure Code(s):    --- Professional ---                       [phone], Colonoscopy, flexible; with biopsy, single or                        multiple Diagnosis Code(s):    --- Professional ---                       K57.30, Diverticulosis of large intestine without                        perforation or abscess without bleeding                       K59.1, Functional diarrhea                       K64.0, First degree hemorrhoids CPT copyright 2018 American Medical Association. All rights reserved. The codes documented in this report are preliminary and upon coder review may  be revised to meet current compliance requirements. Cheyenne Kidney MD, MD 08/19/2018 10:55:29 AM This report has been signed electronically. Number of Addenda: 0 Note Initiated On: 08/19/2018 9:37 AM Scope Withdrawal Time: 0 hours 6 minutes 3 seconds  Total Procedure Duration: 0 hours 9 minutes 6 seconds       [facility]

## 2018-08-19 NOTE — Transfer of Care (Signed)
Immediate Anesthesia Transfer of Care Note  Patient: ADALENA ENSMINGER  Procedure(s) Performed: COLONOSCOPY WITH PROPOFOL (N/A ) ESOPHAGOGASTRODUODENOSCOPY (EGD) WITH PROPOFOL (N/A )  Patient Location: PACU  Anesthesia Type:General  Level of Consciousness: sedated  Airway & Oxygen Therapy: Patient Spontanous Breathing and Patient connected to nasal cannula oxygen  Post-op Assessment: Report given to RN and Post -op Vital signs reviewed and stable  Post vital signs: Reviewed and stable  Last Vitals:  Vitals Value Taken Time  BP 101/41 08/19/2018 10:56 AM  Temp 36.3 C 08/19/2018 10:56 AM  Pulse 71 08/19/2018 10:56 AM  Resp 18 08/19/2018 10:56 AM  SpO2 97 % 08/19/2018 10:56 AM  Vitals shown include unvalidated device data.  Last Pain:  Vitals:   08/19/18 1056  TempSrc: (P) Tympanic  PainSc:          Complications: No apparent anesthesia complications

## 2018-08-19 NOTE — Interval H&P Note (Signed)
History and Physical Interval Note:  08/19/2018 10:23 AM  Cheyenne Grimes  has presented today for surgery, with the diagnosis of Patient history polyps Diarrhea Dysphagia  The various methods of treatment have been discussed with the patient and family. After consideration of risks, benefits and other options for treatment, the patient has consented to  Procedure(s): COLONOSCOPY WITH PROPOFOL (N/A) ESOPHAGOGASTRODUODENOSCOPY (EGD) WITH PROPOFOL (N/A) as a surgical intervention .  The patient's history has been reviewed, patient examined, no change in status, stable for surgery.  I have reviewed the patient's chart and labs.  Questions were answered to the patient's satisfaction.     [place], [place]

## 2018-08-19 NOTE — H&P (Signed)
Outpatient short stay form Pre-procedure 08/19/2018 8:49 AM Cheyenne Grimes K. Cheyenne Grimes, M.D.  Primary Physician: Barbette Reichmann, M.D.  Reason for visit:  Dysphagia, personal hx of colon polyps, diarrhea  History of present illness:  Patient has intermittent pill dysphagia for "many years". Has recurrent "hanging up" of pills just above the suprasternal notch. No weight loss.  Has recurrent diarrhea which is improved with Imodium. Has personal hx of colon polyps.    No current facility-administered medications for this encounter.   Current Outpatient Medications:  .  acetaminophen (TYLENOL) 500 MG tablet, Take 500 mg by mouth every 8 (eight) hours as needed. 3-4 tabs every 8 hours, Disp: , Rfl:  .  ALPRAZolam (XANAX) 0.5 MG tablet, Take 0.5 mg by mouth 2 (two) times daily as needed. , Disp: , Rfl:  .  calcium-vitamin D (OSCAL WITH D) 500-200 MG-UNIT per tablet, Take 1 tablet by mouth daily., Disp: , Rfl:  .  Fluticasone-Salmeterol (ADVAIR) 500-50 MCG/DOSE AEPB, Inhale 1 puff into the lungs 2 (two) times daily., Disp: , Rfl:  .  gabapentin (NEURONTIN) 800 MG tablet, Take 800 mg by mouth 4 (four) times daily., Disp: , Rfl:  .  Garlic 100 MG TABS, Take by mouth daily., Disp: , Rfl:  .  levothyroxine (SYNTHROID, LEVOTHROID) 25 MCG tablet, Take 25 mcg by mouth daily before breakfast., Disp: , Rfl:  .  meloxicam (MOBIC) 15 MG tablet, Take 15 mg by mouth 2 (two) times daily., Disp: , Rfl:  .  methocarbamol (ROBAXIN) 750 MG tablet, Take 750 mg by mouth 3 (three) times daily., Disp: , Rfl:  .  Omega-3 Fatty Acids (FISH OIL) 1000 MG CAPS, Take by mouth daily., Disp: , Rfl:  .  sennosides-docusate sodium (SENOKOT-S) 8.6-50 MG tablet, Take 2 tablets by mouth 2 (two) times daily. Reported on 10/31/2015, Disp: , Rfl:  .  sertraline (ZOLOFT) 100 MG tablet, Take 100 mg by mouth daily., Disp: , Rfl:  .  tiotropium (SPIRIVA) 18 MCG inhalation capsule, Place 18 mcg into inhaler and inhale daily., Disp: , Rfl:  .   vitamin E 400 UNIT capsule, Take 400 Units by mouth daily., Disp: , Rfl:  .  zolpidem (AMBIEN) 5 MG tablet, Take 5 mg by mouth at bedtime as needed for sleep., Disp: , Rfl:   No medications prior to admission.     Allergies  Allergen Reactions  . Ivp Dye [Iodinated Diagnostic Agents] Anaphylaxis, Hives and Rash  . Iodides Hives  . Iodine   . Penicillins Hives     Past Medical History:  Diagnosis Date  . Anemia   . Chronic pain   . COPD (chronic obstructive pulmonary disease) (HCC)   . Fibromyalgia   . Osteoporosis     Review of systems:  Otherwise negative.    Physical Exam  Gen: Alert, oriented. Appears stated age.  HEENT: [place]/AT. PERRLA. Lungs: CTA, no wheezes. CV: RR nl S1, S2. Abd: soft, benign, no masses. BS+ Ext: No edema. Pulses 2+    Planned procedures: Proceed with EGD and colonoscopy. The patient understands the nature of the planned procedure, indications, risks, alternatives and potential complications including but not limited to bleeding, infection, perforation, damage to internal organs and possible oversedation/side effects from anesthesia. The patient agrees and gives consent to proceed.  Please refer to procedure notes for findings, recommendations and patient disposition/instructions.     Cheyenne Grimes K. Cheyenne Grimes, M.D. Gastroenterology 08/19/2018  8:49 AM

## 2018-08-19 NOTE — Anesthesia Preprocedure Evaluation (Signed)
Anesthesia Evaluation  Patient identified by MRN, date of birth, ID band Patient awake    Reviewed: Allergy & Precautions, H&P , NPO status , Patient's Chart, lab work & pertinent test results, reviewed documented beta blocker date and time   Airway Mallampati: II   Neck ROM: full    Dental  (+) Poor Dentition   Pulmonary neg pulmonary ROS, COPD, Current Smoker,    Pulmonary exam normal        Cardiovascular Exercise Tolerance: Poor negative cardio ROS Normal cardiovascular exam Rhythm:regular Rate:Normal     Neuro/Psych  Headaches, PSYCHIATRIC DISORDERS Depression  Neuromuscular disease negative neurological ROS  negative psych ROS   GI/Hepatic negative GI ROS, Neg liver ROS,   Endo/Other  negative endocrine ROS  Renal/GU negative Renal ROS  negative genitourinary   Musculoskeletal   Abdominal   Peds  Hematology negative hematology ROS (+) Blood dyscrasia, anemia ,   Anesthesia Other Findings Past Medical History: No date: Anemia No date: Chronic pain No date: COPD (chronic obstructive pulmonary disease) (HCC) No date: Fibromyalgia No date: Osteoporosis Past Surgical History: No date: arm surgery; Left No date: BREAST BIOPSY; Left No date: CATARACT EXTRACTION; Bilateral No date: CHOLECYSTECTOMY No date: EYE SURGERY No date: KNEE SURGERY; Right No date: KYPHOPLASTY 10/31/2015: KYPHOPLASTY; N/A     Comment:  Procedure:  KYPHOPLASTY T6;  Surgeon: Kennedy Bucker, MD;               Location: [facility] ORS;  Service: Orthopedics;  Laterality:               N/A; No date: Left shoulder surgery No date: REPLACEMENT TOTAL KNEE; Left No date: WRIST SURGERY; Bilateral   Reproductive/Obstetrics negative OB ROS                             Anesthesia Physical Anesthesia Plan  ASA: III  Anesthesia Plan: General   Post-op Pain Management:    Induction:   PONV Risk Score and Plan:   Airway  Management Planned:   Additional Equipment:   Intra-op Plan:   Post-operative Plan:   Informed Consent: I have reviewed the patients History and Physical, chart, labs and discussed the procedure including the risks, benefits and alternatives for the proposed anesthesia with the patient or authorized representative who has indicated his/her understanding and acceptance.     Dental Advisory Given  Plan Discussed with: CRNA  Anesthesia Plan Comments:         Anesthesia Quick Evaluation

## 2018-08-19 NOTE — Anesthesia Postprocedure Evaluation (Signed)
Anesthesia Post Note  Patient: ADRIJA ARGETSINGER  Procedure(s) Performed: COLONOSCOPY WITH PROPOFOL (N/A ) ESOPHAGOGASTRODUODENOSCOPY (EGD) WITH PROPOFOL (N/A )  Patient location during evaluation: PACU Anesthesia Type: General Level of consciousness: awake and alert Pain management: pain level controlled Vital Signs Assessment: post-procedure vital signs reviewed and stable Respiratory status: spontaneous breathing, nonlabored ventilation, respiratory function stable and patient connected to nasal cannula oxygen Cardiovascular status: blood pressure returned to baseline and stable Postop Assessment: no apparent nausea or vomiting Anesthetic complications: no     Last Vitals:  Vitals:   08/19/18 1106 08/19/18 1116  BP: (!) 124/54 (!) 147/59  Pulse: 72 71  Resp: 18 19  Temp:    SpO2: 97% 97%    Last Pain:  Vitals:   08/19/18 1116  TempSrc:   PainSc: 0-No pain                 Yevette Edwards

## 2018-08-19 NOTE — Anesthesia Post-op Follow-up Note (Signed)
Anesthesia QCDR form completed.        

## 2018-08-20 ENCOUNTER — Encounter: Payer: Self-pay | Admitting: Internal Medicine

## 2018-08-20 LAB — SURGICAL PATHOLOGY

## 2018-09-10 ENCOUNTER — Other Ambulatory Visit: Payer: Self-pay

## 2018-09-10 ENCOUNTER — Ambulatory Visit
Payer: PPO | Attending: Student in an Organized Health Care Education/Training Program | Admitting: Student in an Organized Health Care Education/Training Program

## 2018-09-10 DIAGNOSIS — M47816 Spondylosis without myelopathy or radiculopathy, lumbar region: Secondary | ICD-10-CM

## 2018-09-10 DIAGNOSIS — M5136 Other intervertebral disc degeneration, lumbar region: Secondary | ICD-10-CM | POA: Diagnosis not present

## 2018-09-10 DIAGNOSIS — G894 Chronic pain syndrome: Secondary | ICD-10-CM

## 2018-09-10 NOTE — Progress Notes (Signed)
Virtual Visit via Telephone Note  I connected with Cheyenne Grimes on 09/10/18 at 12:15 PM EDT by telephone and verified that I am speaking with the correct person using two identifiers.   I discussed the limitations, risks, security and privacy concerns of performing an evaluation and management service by telephone and the availability of in person appointments. I also discussed with the patient that there may be a patient responsible charge related to this service. The patient expressed understanding and agreed to proceed.   History of Present Illness: Postprocedural phone call status post bilateral L3, L4, L5, S1 facet medial branch nerve blocks #1 performed on 08/12/2018.  Patient endorses significant pain relief, rated as approximately 80% for the first 2 weeks with decreasing pain relief to approximately 50% that is ongoing.   Observations/Objective: Improved pain levels, improved functional status, easier to ambulate around the house.  Assessment and Plan: 68 year old female with history of lumbar facet arthropathy, lumbar spondylosis, tele-visit performed for postprocedural checkup.  Patient overall is doing well after her diagnostic bilateral lumbar facet medial branch nerve blocks #1 on 08/12/2018.  If and when patient has return of pain, discussed repeating diagnostic blocks #2 followed possibly by lumbar radiofrequency ablation.  Risks and benefits of this procedure were reviewed and patient will call to schedule if and when axial low back and buttock pain returns.  1. Lumbar facet arthropathy   2. Lumbar degenerative disc disease   3. Chronic pain syndrome    Orders Placed This Encounter  Procedures  . LUMBAR FACET(MEDIAL BRANCH NERVE BLOCK) MBNB    Standing Status:   Standing    Number of Occurrences:   5    Standing Expiration Date:   09/10/2019    Scheduling Instructions:     Purpose: Diagnostic     Indication: Axial low back pain. Lumbosacral Spondylosis (M47.897).    Side: Bilateral     Level: L3-4, L4-5, & L5-S1 Facets ( L3, L4, L5, & S1 Medial Branch Nerves)     Sedation: With Sedation.     TIMEFRAME: PRN procedure. (Ms. Wissel will call when needed.)    Order Specific Question:   Where will this procedure be performed?    Answer:   [facility]     Follow Up Instructions:   I discussed the assessment and treatment plan with the patient. The patient was provided an opportunity to ask questions and all were answered. The patient agreed with the plan and demonstrated an understanding of the instructions.   The patient was advised to call back or seek an in-person evaluation if the symptoms worsen or if the condition fails to improve as anticipated.  I provided 15 minutes of non-face-to-face time during this encounter.   Edward Jolly, MD

## 2018-12-17 DIAGNOSIS — Z1239 Encounter for other screening for malignant neoplasm of breast: Secondary | ICD-10-CM | POA: Diagnosis not present

## 2018-12-17 DIAGNOSIS — M810 Age-related osteoporosis without current pathological fracture: Secondary | ICD-10-CM | POA: Diagnosis not present

## 2018-12-17 DIAGNOSIS — R5383 Other fatigue: Secondary | ICD-10-CM | POA: Diagnosis not present

## 2018-12-17 DIAGNOSIS — Z0000 Encounter for general adult medical examination without abnormal findings: Secondary | ICD-10-CM | POA: Diagnosis not present

## 2018-12-17 DIAGNOSIS — J439 Emphysema, unspecified: Secondary | ICD-10-CM | POA: Diagnosis not present

## 2018-12-17 DIAGNOSIS — R5381 Other malaise: Secondary | ICD-10-CM | POA: Diagnosis not present

## 2018-12-17 DIAGNOSIS — F331 Major depressive disorder, recurrent, moderate: Secondary | ICD-10-CM | POA: Diagnosis not present

## 2018-12-17 DIAGNOSIS — J3089 Other allergic rhinitis: Secondary | ICD-10-CM | POA: Diagnosis not present

## 2018-12-17 DIAGNOSIS — E039 Hypothyroidism, unspecified: Secondary | ICD-10-CM | POA: Diagnosis not present

## 2018-12-17 DIAGNOSIS — M797 Fibromyalgia: Secondary | ICD-10-CM | POA: Diagnosis not present

## 2018-12-17 DIAGNOSIS — F419 Anxiety disorder, unspecified: Secondary | ICD-10-CM | POA: Diagnosis not present

## 2018-12-17 DIAGNOSIS — M5136 Other intervertebral disc degeneration, lumbar region: Secondary | ICD-10-CM | POA: Diagnosis not present

## 2018-12-17 DIAGNOSIS — R635 Abnormal weight gain: Secondary | ICD-10-CM | POA: Diagnosis not present

## 2018-12-17 DIAGNOSIS — Z720 Tobacco use: Secondary | ICD-10-CM | POA: Diagnosis not present

## 2018-12-21 ENCOUNTER — Other Ambulatory Visit: Payer: Self-pay | Admitting: Internal Medicine

## 2018-12-21 DIAGNOSIS — Z1231 Encounter for screening mammogram for malignant neoplasm of breast: Secondary | ICD-10-CM

## 2019-01-13 DIAGNOSIS — M810 Age-related osteoporosis without current pathological fracture: Secondary | ICD-10-CM | POA: Diagnosis not present

## 2019-01-13 DIAGNOSIS — F331 Major depressive disorder, recurrent, moderate: Secondary | ICD-10-CM | POA: Diagnosis not present

## 2019-01-13 DIAGNOSIS — F419 Anxiety disorder, unspecified: Secondary | ICD-10-CM | POA: Diagnosis not present

## 2019-01-13 DIAGNOSIS — M797 Fibromyalgia: Secondary | ICD-10-CM | POA: Diagnosis not present

## 2019-01-13 DIAGNOSIS — J439 Emphysema, unspecified: Secondary | ICD-10-CM | POA: Diagnosis not present

## 2019-01-13 DIAGNOSIS — M5136 Other intervertebral disc degeneration, lumbar region: Secondary | ICD-10-CM | POA: Diagnosis not present

## 2019-09-16 DEATH — deceased
